# Patient Record
Sex: Male | Born: 1955 | Race: White | Hispanic: No | Marital: Married | Smoking: Never smoker
Health system: Southern US, Community
[De-identification: ages and names within clinical notes are randomized; demographics above are authoritative.]

## PROBLEM LIST (undated history)

## (undated) DIAGNOSIS — F0390 Unspecified dementia without behavioral disturbance: Secondary | ICD-10-CM

## (undated) DIAGNOSIS — I10 Essential (primary) hypertension: Secondary | ICD-10-CM

## (undated) DIAGNOSIS — K219 Gastro-esophageal reflux disease without esophagitis: Secondary | ICD-10-CM

## (undated) DIAGNOSIS — F4310 Post-traumatic stress disorder, unspecified: Secondary | ICD-10-CM

## (undated) DIAGNOSIS — G478 Other sleep disorders: Secondary | ICD-10-CM

## (undated) DIAGNOSIS — I350 Nonrheumatic aortic (valve) stenosis: Secondary | ICD-10-CM

## (undated) DIAGNOSIS — G939 Disorder of brain, unspecified: Secondary | ICD-10-CM

## (undated) DIAGNOSIS — G709 Myoneural disorder, unspecified: Secondary | ICD-10-CM

## (undated) DIAGNOSIS — S069X9A Unspecified intracranial injury with loss of consciousness of unspecified duration, initial encounter: Secondary | ICD-10-CM

## (undated) DIAGNOSIS — N189 Chronic kidney disease, unspecified: Secondary | ICD-10-CM

## (undated) DIAGNOSIS — S069XAA Unspecified intracranial injury with loss of consciousness status unknown, initial encounter: Secondary | ICD-10-CM

## (undated) DIAGNOSIS — K759 Inflammatory liver disease, unspecified: Secondary | ICD-10-CM

## (undated) DIAGNOSIS — F419 Anxiety disorder, unspecified: Secondary | ICD-10-CM

## (undated) DIAGNOSIS — Z87442 Personal history of urinary calculi: Secondary | ICD-10-CM

## (undated) DIAGNOSIS — M792 Neuralgia and neuritis, unspecified: Secondary | ICD-10-CM

## (undated) HISTORY — PX: TONSILLECTOMY: SUR1361

## (undated) HISTORY — PX: BACK SURGERY: SHX140

---

## 2004-03-26 ENCOUNTER — Inpatient Hospital Stay (HOSPITAL_COMMUNITY): Admission: AD | Admit: 2004-03-26 | Discharge: 2004-03-28 | Payer: Self-pay | Admitting: Cardiovascular Disease

## 2007-03-30 DIAGNOSIS — K760 Fatty (change of) liver, not elsewhere classified: Secondary | ICD-10-CM | POA: Insufficient documentation

## 2007-03-30 DIAGNOSIS — E785 Hyperlipidemia, unspecified: Secondary | ICD-10-CM | POA: Insufficient documentation

## 2009-02-08 DIAGNOSIS — I10 Essential (primary) hypertension: Secondary | ICD-10-CM | POA: Insufficient documentation

## 2009-07-10 DIAGNOSIS — E119 Type 2 diabetes mellitus without complications: Secondary | ICD-10-CM | POA: Insufficient documentation

## 2011-01-28 DIAGNOSIS — F4323 Adjustment disorder with mixed anxiety and depressed mood: Secondary | ICD-10-CM

## 2011-01-28 DIAGNOSIS — M545 Low back pain, unspecified: Secondary | ICD-10-CM | POA: Insufficient documentation

## 2011-01-28 HISTORY — DX: Adjustment disorder with mixed anxiety and depressed mood: F43.23

## 2011-03-02 DIAGNOSIS — F4310 Post-traumatic stress disorder, unspecified: Secondary | ICD-10-CM | POA: Insufficient documentation

## 2011-09-29 ENCOUNTER — Other Ambulatory Visit: Payer: Self-pay | Admitting: Otolaryngology

## 2011-10-09 ENCOUNTER — Encounter (HOSPITAL_COMMUNITY): Payer: Self-pay | Admitting: Pharmacy Technician

## 2011-10-15 ENCOUNTER — Encounter (HOSPITAL_COMMUNITY): Payer: Self-pay

## 2011-10-15 ENCOUNTER — Ambulatory Visit (HOSPITAL_COMMUNITY)
Admission: RE | Admit: 2011-10-15 | Discharge: 2011-10-15 | Disposition: A | Discharge status: Home / Self Care | Payer: Worker's Compensation | Source: Ambulatory Visit | Attending: Specialist | Admitting: Specialist

## 2011-10-15 ENCOUNTER — Encounter (HOSPITAL_COMMUNITY)
Admission: RE | Admit: 2011-10-15 | Discharge: 2011-10-15 | Disposition: A | Discharge status: Home / Self Care | Payer: Worker's Compensation | Source: Ambulatory Visit | Attending: Specialist | Admitting: Specialist

## 2011-10-15 DIAGNOSIS — G709 Myoneural disorder, unspecified: Secondary | ICD-10-CM

## 2011-10-15 DIAGNOSIS — M5380 Other specified dorsopathies, site unspecified: Secondary | ICD-10-CM | POA: Insufficient documentation

## 2011-10-15 DIAGNOSIS — I10 Essential (primary) hypertension: Secondary | ICD-10-CM | POA: Insufficient documentation

## 2011-10-15 DIAGNOSIS — Z01812 Encounter for preprocedural laboratory examination: Secondary | ICD-10-CM | POA: Insufficient documentation

## 2011-10-15 DIAGNOSIS — F0390 Unspecified dementia without behavioral disturbance: Secondary | ICD-10-CM

## 2011-10-15 DIAGNOSIS — K759 Inflammatory liver disease, unspecified: Secondary | ICD-10-CM

## 2011-10-15 DIAGNOSIS — N189 Chronic kidney disease, unspecified: Secondary | ICD-10-CM

## 2011-10-15 DIAGNOSIS — M5126 Other intervertebral disc displacement, lumbar region: Secondary | ICD-10-CM | POA: Insufficient documentation

## 2011-10-15 DIAGNOSIS — G478 Other sleep disorders: Secondary | ICD-10-CM

## 2011-10-15 DIAGNOSIS — I700 Atherosclerosis of aorta: Secondary | ICD-10-CM | POA: Insufficient documentation

## 2011-10-15 DIAGNOSIS — M792 Neuralgia and neuritis, unspecified: Secondary | ICD-10-CM

## 2011-10-15 DIAGNOSIS — F419 Anxiety disorder, unspecified: Secondary | ICD-10-CM

## 2011-10-15 DIAGNOSIS — Z01810 Encounter for preprocedural cardiovascular examination: Secondary | ICD-10-CM | POA: Insufficient documentation

## 2011-10-15 HISTORY — DX: Chronic kidney disease, unspecified: N18.9

## 2011-10-15 HISTORY — DX: Unspecified dementia, unspecified severity, without behavioral disturbance, psychotic disturbance, mood disturbance, and anxiety: F03.90

## 2011-10-15 HISTORY — DX: Anxiety disorder, unspecified: F41.9

## 2011-10-15 HISTORY — DX: Essential (primary) hypertension: I10

## 2011-10-15 HISTORY — DX: Myoneural disorder, unspecified: G70.9

## 2011-10-15 HISTORY — DX: Other sleep disorders: G47.8

## 2011-10-15 HISTORY — DX: Gastro-esophageal reflux disease without esophagitis: K21.9

## 2011-10-15 HISTORY — DX: Unspecified dementia without behavioral disturbance: F03.90

## 2011-10-15 HISTORY — DX: Inflammatory liver disease, unspecified: K75.9

## 2011-10-15 HISTORY — DX: Neuralgia and neuritis, unspecified: M79.2

## 2011-10-15 HISTORY — PX: HERNIA REPAIR: SHX51

## 2011-10-15 HISTORY — PX: OTHER SURGICAL HISTORY: SHX169

## 2011-10-15 LAB — COMPREHENSIVE METABOLIC PANEL
ALT: 23 U/L (ref 0–53)
AST: 18 U/L (ref 0–37)
Albumin: 4 g/dL (ref 3.5–5.2)
Alkaline Phosphatase: 57 U/L (ref 39–117)
BUN: 14 mg/dL (ref 6–23)
CO2: 24 mEq/L (ref 19–32)
Calcium: 9.6 mg/dL (ref 8.4–10.5)
Chloride: 95 mEq/L — ABNORMAL LOW (ref 96–112)
Creatinine, Ser: 0.96 mg/dL (ref 0.50–1.35)
GFR calc Af Amer: >90 mL/min (ref >90)
GFR calc non Af Amer: >90 mL/min (ref >90)
Glucose, Bld: 188 mg/dL — ABNORMAL HIGH (ref 70–99)
Potassium: 4 mEq/L (ref 3.5–5.1)
Sodium: 134 mEq/L — ABNORMAL LOW (ref 135–145)
Total Bilirubin: 0.2 mg/dL — ABNORMAL LOW (ref 0.3–1.2)
Total Protein: 7 g/dL (ref 6.0–8.3)

## 2011-10-15 LAB — DIFFERENTIAL
Basophils Absolute: 0 10*3/uL (ref 0.0–0.1)
Basophils Relative: 0 % (ref 0–1)
Eosinophils Absolute: 0.2 10*3/uL (ref 0.0–0.7)
Eosinophils Relative: 2 % (ref 0–5)
Lymphocytes Relative: 36 % (ref 12–46)
Lymphs Abs: 3 10*3/uL (ref 0.7–4.0)
Monocytes Absolute: 0.5 10*3/uL (ref 0.1–1.0)
Monocytes Relative: 6 % (ref 3–12)
Neutro Abs: 4.7 10*3/uL (ref 1.7–7.7)
Neutrophils Relative %: 56 % (ref 43–77)

## 2011-10-15 LAB — URINALYSIS, ROUTINE W REFLEX MICROSCOPIC
Bilirubin Urine: NEGATIVE
Glucose, UA: NEGATIVE mg/dL
Hgb urine dipstick: NEGATIVE
Ketones, ur: NEGATIVE mg/dL
Leukocytes, UA: NEGATIVE
Nitrite: NEGATIVE
Protein, ur: NEGATIVE mg/dL
Specific Gravity, Urine: 1.026 (ref 1.005–1.030)
Urobilinogen, UA: 0.2 mg/dL (ref 0.0–1.0)
pH: 5.5 (ref 5.0–8.0)

## 2011-10-15 LAB — CBC
HCT: 44.6 % (ref 39.0–52.0)
Hemoglobin: 15.4 g/dL (ref 13.0–17.0)
MCH: 29.7 pg (ref 26.0–34.0)
MCHC: 34.5 g/dL (ref 30.0–36.0)
MCV: 86.1 fL (ref 78.0–100.0)
Platelets: 293 10*3/uL (ref 150–400)
RBC: 5.18 MIL/uL (ref 4.22–5.81)
RDW: 13 % (ref 11.5–15.5)
WBC: 8.4 10*3/uL (ref 4.0–10.5)

## 2011-10-15 LAB — SURGICAL PCR SCREEN
MRSA, PCR: NEGATIVE
Staphylococcus aureus: NEGATIVE
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
## 2011-10-15 NOTE — Patient Instructions (Addendum)
20 NICKALAS MCCARRICK  10/15/2011   Your procedure is scheduled on:  7-17 -2013  Report to [facility] at     1030   AM .  Call this number if you have problems the morning of surgery: [phone]   Remember:   Do not eat food:After Midnight.  May have clear liquids:up to 6 Hours before arrival. Nothing after : 0600 am  Clear liquids include soda, tea, black coffee, apple or grape juice, broth.  Take these medicines the morning of surgery with A SIP OF WATER: Lamictal, Cerofolin, Ativan, Prilosec, Paxil, Vayacog. Use 1/2 dose ususal PM Insulin( 10-20-11)- Donot take Diabetic meds or insulin AM of surgery.   Do not wear jewelry, make-up or nail polish.  Do not wear lotions, powders, or perfumes. You may wear deodorant.  Do not shave 48 hours prior to surgery.(face and neck okay, no shaving of legs)  Do not bring valuables to the hospital.  Contacts, dentures or bridgework may not be worn into surgery.  Leave suitcase in the car. After surgery it may be brought to your room.  For patients admitted to the hospital, checkout time is 11:00 AM the day of discharge.   Patients discharged the day of surgery will not be allowed to drive home.  Name and phone number of your driver: spouse  Special Instructions: CHG Shower Use Special Wash: 1/2 bottle night before surgery and 1/2 bottle morning of surgery.(avoid face and genitals)   Please read over the following fact sheets that you were given: MRSA Information,  Incentive Spirometry Instruction.

## 2011-10-21 ENCOUNTER — Ambulatory Visit (HOSPITAL_COMMUNITY): Payer: Worker's Compensation

## 2011-10-21 ENCOUNTER — Encounter (HOSPITAL_COMMUNITY)
Admission: RE | Disposition: A | Discharge status: Home / Self Care | Payer: Self-pay | Source: Ambulatory Visit | Attending: Specialist

## 2011-10-21 ENCOUNTER — Observation Stay (HOSPITAL_COMMUNITY)
Admission: RE | Admit: 2011-10-21 | Discharge: 2011-10-22 | Disposition: A | Discharge status: Home / Self Care | Payer: Worker's Compensation | Source: Ambulatory Visit | Attending: Specialist | Admitting: Specialist

## 2011-10-21 ENCOUNTER — Ambulatory Visit (HOSPITAL_COMMUNITY): Payer: Worker's Compensation | Admitting: Anesthesiology

## 2011-10-21 ENCOUNTER — Encounter (HOSPITAL_COMMUNITY): Payer: Self-pay | Admitting: Anesthesiology

## 2011-10-21 ENCOUNTER — Encounter (HOSPITAL_COMMUNITY): Payer: Self-pay | Admitting: *Deleted

## 2011-10-21 DIAGNOSIS — I10 Essential (primary) hypertension: Secondary | ICD-10-CM | POA: Insufficient documentation

## 2011-10-21 DIAGNOSIS — M48061 Spinal stenosis, lumbar region without neurogenic claudication: Secondary | ICD-10-CM | POA: Diagnosis present

## 2011-10-21 DIAGNOSIS — Z79899 Other long term (current) drug therapy: Secondary | ICD-10-CM | POA: Insufficient documentation

## 2011-10-21 DIAGNOSIS — M5126 Other intervertebral disc displacement, lumbar region: Principal | ICD-10-CM | POA: Diagnosis present

## 2011-10-21 DIAGNOSIS — E119 Type 2 diabetes mellitus without complications: Secondary | ICD-10-CM | POA: Insufficient documentation

## 2011-10-21 DIAGNOSIS — K219 Gastro-esophageal reflux disease without esophagitis: Secondary | ICD-10-CM | POA: Insufficient documentation

## 2011-10-21 DIAGNOSIS — Z794 Long term (current) use of insulin: Secondary | ICD-10-CM | POA: Insufficient documentation

## 2011-10-21 DIAGNOSIS — Z7982 Long term (current) use of aspirin: Secondary | ICD-10-CM | POA: Insufficient documentation

## 2011-10-21 LAB — GLUCOSE, CAPILLARY
Glucose-Capillary: 212 mg/dL — ABNORMAL HIGH (ref 70–99)
Glucose-Capillary: 171 mg/dL — ABNORMAL HIGH (ref 70–99)
Glucose-Capillary: 151 mg/dL — ABNORMAL HIGH (ref 70–99)
Glucose-Capillary: 158 mg/dL — ABNORMAL HIGH (ref 70–99)

## 2011-10-21 SURGERY — DECOMPRESSIVE LUMBAR LAMINECTOMY LEVEL 1
Anesthesia: General | Site: Back | Laterality: Left | Wound class: Clean

## 2011-10-21 MED ORDER — NEOSTIGMINE METHYLSULFATE 1 MG/ML IJ SOLN
INTRAMUSCULAR | Status: DC | PRN
  Administered 2011-10-21: 2 mg via INTRAVENOUS

## 2011-10-21 MED ORDER — ONDANSETRON HCL 4 MG/2ML IJ SOLN: 4.0000 mg | INTRAMUSCULAR | Status: DC | PRN

## 2011-10-21 MED ORDER — NALOXONE HCL 0.4 MG/ML IJ SOLN
INTRAMUSCULAR | Status: AC
  Filled 2011-10-21: qty 1

## 2011-10-21 MED ORDER — L-METHYLFOLATE-B6-B12 3-35-2 MG PO TABS
1.0000 | ORAL_TABLET | Freq: Two times a day (BID) | ORAL | Status: DC
  Administered 2011-10-21 – 2011-10-22 (×2): 1 via ORAL
  Filled 2011-10-21 (×3): qty 1

## 2011-10-21 MED ORDER — THROMBIN 5000 UNITS EX SOLR
CUTANEOUS | Status: AC
  Filled 2011-10-21: qty 10000

## 2011-10-21 MED ORDER — OXYCODONE-ACETAMINOPHEN 7.5-325 MG PO TABS: 1.0000 | ORAL_TABLET | ORAL | Status: AC | PRN

## 2011-10-21 MED ORDER — METHOCARBAMOL 500 MG PO TABS: 500.0000 mg | ORAL_TABLET | Freq: Three times a day (TID) | ORAL | Status: AC

## 2011-10-21 MED ORDER — HYDROMORPHONE HCL PF 1 MG/ML IJ SOLN: 0.2500 mg | INTRAMUSCULAR | Status: DC | PRN

## 2011-10-21 MED ORDER — CEFAZOLIN SODIUM-DEXTROSE 2-3 GM-% IV SOLR
INTRAVENOUS | Status: AC
  Filled 2011-10-21: qty 50

## 2011-10-21 MED ORDER — LAMOTRIGINE 150 MG PO TABS
150.0000 mg | ORAL_TABLET | Freq: Every day | ORAL | Status: DC
  Administered 2011-10-22: 150 mg via ORAL
  Filled 2011-10-21: qty 1

## 2011-10-21 MED ORDER — SODIUM CHLORIDE 0.9 % IJ SOLN: 3.0000 mL | Freq: Two times a day (BID) | INTRAMUSCULAR | Status: DC

## 2011-10-21 MED ORDER — FENTANYL CITRATE 0.05 MG/ML IJ SOLN
INTRAMUSCULAR | Status: DC | PRN
  Administered 2011-10-21 (×2): 100 ug via INTRAVENOUS
  Administered 2011-10-21 (×2): 50 ug via INTRAVENOUS

## 2011-10-21 MED ORDER — CHLORHEXIDINE GLUCONATE 4 % EX LIQD
60.0000 mL | Freq: Once | CUTANEOUS | Status: DC
  Filled 2011-10-21: qty 60

## 2011-10-21 MED ORDER — CEFAZOLIN SODIUM-DEXTROSE 2-3 GM-% IV SOLR
2.0000 g | Freq: Three times a day (TID) | INTRAVENOUS | Status: AC
  Administered 2011-10-21 – 2011-10-22 (×2): 2 g via INTRAVENOUS
  Filled 2011-10-21 (×2): qty 50

## 2011-10-21 MED ORDER — BUPIVACAINE-EPINEPHRINE (PF) 0.5% -1:200000 IJ SOLN
INTRAMUSCULAR | Status: AC
  Filled 2011-10-21: qty 10

## 2011-10-21 MED ORDER — LACTATED RINGERS IV SOLN: INTRAVENOUS | Status: DC

## 2011-10-21 MED ORDER — SODIUM CHLORIDE 0.9 % IJ SOLN: 3.0000 mL | INTRAMUSCULAR | Status: DC | PRN

## 2011-10-21 MED ORDER — ACETAMINOPHEN 10 MG/ML IV SOLN
INTRAVENOUS | Status: AC
  Filled 2011-10-21: qty 100

## 2011-10-21 MED ORDER — ACETAMINOPHEN 10 MG/ML IV SOLN
INTRAVENOUS | Status: DC | PRN
  Administered 2011-10-21: 1000 mg via INTRAVENOUS

## 2011-10-21 MED ORDER — ZOLPIDEM TARTRATE 10 MG PO TABS
10.0000 mg | ORAL_TABLET | Freq: Every day | ORAL | Status: DC
  Administered 2011-10-21: 10 mg via ORAL
  Filled 2011-10-21: qty 1

## 2011-10-21 MED ORDER — THROMBIN 5000 UNITS EX SOLR
CUTANEOUS | Status: DC | PRN
  Administered 2011-10-21: 10000 [IU] via TOPICAL

## 2011-10-21 MED ORDER — HYDROMORPHONE HCL PF 1 MG/ML IJ SOLN
0.5000 mg | INTRAMUSCULAR | Status: DC | PRN
  Administered 2011-10-21 (×2): 1 mg via INTRAVENOUS
  Administered 2011-10-21: 0.5 mg via INTRAVENOUS
  Administered 2011-10-22 (×3): 1 mg via INTRAVENOUS
  Filled 2011-10-21 (×6): qty 1

## 2011-10-21 MED ORDER — BUPIVACAINE-EPINEPHRINE 0.5% -1:200000 IJ SOLN
INTRAMUSCULAR | Status: DC | PRN
  Administered 2011-10-21: 30 mL

## 2011-10-21 MED ORDER — MENTHOL 3 MG MT LOZG: 1.0000 | LOZENGE | OROMUCOSAL | Status: DC | PRN

## 2011-10-21 MED ORDER — INSULIN ASPART 100 UNIT/ML ~~LOC~~ SOLN
0.0000 [IU] | Freq: Three times a day (TID) | SUBCUTANEOUS | Status: DC
  Administered 2011-10-21: 3 [IU] via SUBCUTANEOUS
  Administered 2011-10-22: 2 [IU] via SUBCUTANEOUS
  Administered 2011-10-22: 5 [IU] via SUBCUTANEOUS

## 2011-10-21 MED ORDER — METHOCARBAMOL 500 MG PO TABS
500.0000 mg | ORAL_TABLET | Freq: Four times a day (QID) | ORAL | Status: DC | PRN
  Administered 2011-10-22: 500 mg via ORAL
  Filled 2011-10-21: qty 1

## 2011-10-21 MED ORDER — DOCUSATE SODIUM 100 MG PO CAPS
100.0000 mg | ORAL_CAPSULE | Freq: Two times a day (BID) | ORAL | Status: DC
  Administered 2011-10-21 – 2011-10-22 (×2): 100 mg via ORAL

## 2011-10-21 MED ORDER — LIDOCAINE HCL (CARDIAC) 20 MG/ML IV SOLN
INTRAVENOUS | Status: DC | PRN
  Administered 2011-10-21: 50 mg via INTRAVENOUS

## 2011-10-21 MED ORDER — EPHEDRINE SULFATE 50 MG/ML IJ SOLN
INTRAMUSCULAR | Status: DC | PRN
  Administered 2011-10-21 (×2): 10 mg via INTRAVENOUS

## 2011-10-21 MED ORDER — ACETAMINOPHEN 325 MG PO TABS: 650.0000 mg | ORAL_TABLET | ORAL | Status: DC | PRN

## 2011-10-21 MED ORDER — LISINOPRIL-HYDROCHLOROTHIAZIDE 10-12.5 MG PO TABS: 1.0000 | ORAL_TABLET | Freq: Every day | ORAL | Status: DC

## 2011-10-21 MED ORDER — LACTATED RINGERS IV SOLN
INTRAVENOUS | Status: DC | PRN
  Administered 2011-10-21 (×3): via INTRAVENOUS

## 2011-10-21 MED ORDER — GLIPIZIDE 10 MG PO TABS
10.0000 mg | ORAL_TABLET | Freq: Two times a day (BID) | ORAL | Status: DC
  Administered 2011-10-21 – 2011-10-22 (×2): 10 mg via ORAL
  Filled 2011-10-21 (×4): qty 1

## 2011-10-21 MED ORDER — SODIUM CHLORIDE 0.9 % IR SOLN
Status: DC | PRN
  Administered 2011-10-21: 14:00:00

## 2011-10-21 MED ORDER — PANTOPRAZOLE SODIUM 40 MG PO TBEC
40.0000 mg | DELAYED_RELEASE_TABLET | Freq: Every day | ORAL | Status: DC
  Administered 2011-10-22: 40 mg via ORAL
  Filled 2011-10-21: qty 1

## 2011-10-21 MED ORDER — MIDAZOLAM HCL 5 MG/5ML IJ SOLN
INTRAMUSCULAR | Status: DC | PRN
  Administered 2011-10-21 (×2): 2 mg via INTRAVENOUS

## 2011-10-21 MED ORDER — OXYCODONE-ACETAMINOPHEN 5-325 MG PO TABS
1.0000 | ORAL_TABLET | ORAL | Status: DC | PRN
  Administered 2011-10-21 – 2011-10-22 (×4): 2 via ORAL
  Filled 2011-10-21 (×4): qty 2

## 2011-10-21 MED ORDER — PAROXETINE HCL 20 MG PO TABS
20.0000 mg | ORAL_TABLET | Freq: Every day | ORAL | Status: DC
  Administered 2011-10-22: 20 mg via ORAL
  Filled 2011-10-21: qty 1

## 2011-10-21 MED ORDER — LISINOPRIL 10 MG PO TABS
10.0000 mg | ORAL_TABLET | Freq: Every day | ORAL | Status: DC
  Administered 2011-10-22: 10 mg via ORAL
  Filled 2011-10-21: qty 1

## 2011-10-21 MED ORDER — ACETAMINOPHEN 650 MG RE SUPP: 650.0000 mg | RECTAL | Status: DC | PRN

## 2011-10-21 MED ORDER — SUCCINYLCHOLINE CHLORIDE 20 MG/ML IJ SOLN
INTRAMUSCULAR | Status: DC | PRN
  Administered 2011-10-21: 100 mg via INTRAVENOUS

## 2011-10-21 MED ORDER — METFORMIN HCL 500 MG PO TABS
1000.0000 mg | ORAL_TABLET | Freq: Two times a day (BID) | ORAL | Status: DC
  Administered 2011-10-21 – 2011-10-22 (×2): 1000 mg via ORAL
  Filled 2011-10-21 (×4): qty 2

## 2011-10-21 MED ORDER — GLYCOPYRROLATE 0.2 MG/ML IJ SOLN
INTRAMUSCULAR | Status: DC | PRN
  Administered 2011-10-21: 0.3 mg via INTRAVENOUS

## 2011-10-21 MED ORDER — INSULIN GLARGINE 100 UNIT/ML ~~LOC~~ SOLN
30.0000 [IU] | Freq: Every day | SUBCUTANEOUS | Status: DC
  Administered 2011-10-21: 32 [IU] via SUBCUTANEOUS

## 2011-10-21 MED ORDER — LACTATED RINGERS IV SOLN
INTRAVENOUS | Status: DC
  Administered 2011-10-21: 1000 mL via INTRAVENOUS

## 2011-10-21 MED ORDER — CEFAZOLIN SODIUM-DEXTROSE 2-3 GM-% IV SOLR
2.0000 g | INTRAVENOUS | Status: AC
  Administered 2011-10-21: 2 g via INTRAVENOUS

## 2011-10-21 MED ORDER — ROCURONIUM BROMIDE 100 MG/10ML IV SOLN
INTRAVENOUS | Status: DC | PRN
  Administered 2011-10-21: 40 mg via INTRAVENOUS

## 2011-10-21 MED ORDER — PHENOL 1.4 % MT LIQD: 1.0000 | OROMUCOSAL | Status: DC | PRN

## 2011-10-21 MED ORDER — SODIUM CHLORIDE 0.45 % IV SOLN
INTRAVENOUS | Status: DC
  Administered 2011-10-21 – 2011-10-22 (×2): via INTRAVENOUS

## 2011-10-21 MED ORDER — HYDROCHLOROTHIAZIDE 12.5 MG PO CAPS
12.5000 mg | ORAL_CAPSULE | Freq: Every day | ORAL | Status: DC
  Administered 2011-10-22: 12.5 mg via ORAL
  Filled 2011-10-21: qty 1

## 2011-10-21 MED ORDER — METHOCARBAMOL 100 MG/ML IJ SOLN
500.0000 mg | Freq: Four times a day (QID) | INTRAVENOUS | Status: DC | PRN
  Administered 2011-10-21 – 2011-10-22 (×3): 500 mg via INTRAVENOUS
  Filled 2011-10-21 (×3): qty 5

## 2011-10-21 MED ORDER — INSULIN GLARGINE 100 UNIT/ML ~~LOC~~ SOLN: 10.0000 [IU] | Freq: Every day | SUBCUTANEOUS | Status: DC

## 2011-10-21 MED ORDER — LORAZEPAM 1 MG PO TABS
1.0000 mg | ORAL_TABLET | Freq: Two times a day (BID) | ORAL | Status: DC
  Administered 2011-10-21 – 2011-10-22 (×2): 1 mg via ORAL
  Filled 2011-10-21 (×2): qty 1

## 2011-10-21 MED ORDER — PROPOFOL 10 MG/ML IV BOLUS
INTRAVENOUS | Status: DC | PRN
  Administered 2011-10-21: 200 mg via INTRAVENOUS

## 2011-10-21 SURGICAL SUPPLY — 51 items
APL SKNCLS STERI-STRIP NONHPOA (GAUZE/BANDAGES/DRESSINGS) ×1
BAG SPEC THK2 15X12 ZIP CLS (MISCELLANEOUS) ×1
BAG ZIPLOCK 12X15 (MISCELLANEOUS) ×2 IMPLANT
BENZOIN TINCTURE PRP APPL 2/3 (GAUZE/BANDAGES/DRESSINGS) ×2 IMPLANT
CATH FOLEY LATEX FREE 16FR (CATHETERS) ×1 IMPLANT
CHLORAPREP W/TINT 26ML (MISCELLANEOUS) IMPLANT
CLEANER TIP ELECTROSURG 2X2 (MISCELLANEOUS) ×2 IMPLANT
CLOTH BEACON ORANGE TIMEOUT ST (SAFETY) ×2 IMPLANT
DECANTER SPIKE VIAL GLASS SM (MISCELLANEOUS) ×2 IMPLANT
DRAPE MICROSCOPE LEICA (MISCELLANEOUS) ×2 IMPLANT
DRAPE POUCH INSTRU U-SHP 10X18 (DRAPES) ×2 IMPLANT
DRAPE SURG 17X11 SM STRL (DRAPES) ×2 IMPLANT
DRSG ADAPTIC 3X8 NADH LF (GAUZE/BANDAGES/DRESSINGS) ×1 IMPLANT
DRSG EMULSION OIL 3X3 NADH (GAUZE/BANDAGES/DRESSINGS) IMPLANT
DRSG PAD ABDOMINAL 8X10 ST (GAUZE/BANDAGES/DRESSINGS) ×1 IMPLANT
DRSG TELFA 4X5 ISLAND ADH (GAUZE/BANDAGES/DRESSINGS) IMPLANT
DURAPREP 26ML APPLICATOR (WOUND CARE) ×2 IMPLANT
ELECT REM PT RETURN 9FT ADLT (ELECTROSURGICAL) ×2
ELECTRODE REM PT RTRN 9FT ADLT (ELECTROSURGICAL) ×1 IMPLANT
GLOVE BIOGEL PI IND STRL 8 (GLOVE) ×1 IMPLANT
GLOVE BIOGEL PI INDICATOR 8 (GLOVE) ×1
GLOVE ECLIPSE 6.5 STRL STRAW (GLOVE) ×2 IMPLANT
GLOVE INDICATOR 6.5 STRL GRN (GLOVE) ×2 IMPLANT
GLOVE SURG SS PI 8.0 STRL IVOR (GLOVE) ×4 IMPLANT
GOWN STRL NON-REIN LRG LVL3 (GOWN DISPOSABLE) ×2 IMPLANT
GOWN STRL REIN XL XLG (GOWN DISPOSABLE) ×2 IMPLANT
KIT BASIN OR (CUSTOM PROCEDURE TRAY) ×2 IMPLANT
KIT POSITIONING SURG ANDREWS (MISCELLANEOUS) ×2 IMPLANT
MANIFOLD NEPTUNE II (INSTRUMENTS) ×2 IMPLANT
NDL SPNL 18GX3.5 QUINCKE PK (NEEDLE) ×2 IMPLANT
NEEDLE SPNL 18GX3.5 QUINCKE PK (NEEDLE) ×4 IMPLANT
PATTIES SURGICAL .5 X.5 (GAUZE/BANDAGES/DRESSINGS) IMPLANT
PATTIES SURGICAL .75X.75 (GAUZE/BANDAGES/DRESSINGS) IMPLANT
PATTIES SURGICAL 1X1 (DISPOSABLE) IMPLANT
SPONGE GAUZE 4X4 12PLY (GAUZE/BANDAGES/DRESSINGS) ×1 IMPLANT
SPONGE SURGIFOAM ABS GEL 100 (HEMOSTASIS) ×2 IMPLANT
STAPLER VISISTAT (STAPLE) IMPLANT
STRIP CLOSURE SKIN 1/2X4 (GAUZE/BANDAGES/DRESSINGS) IMPLANT
SUT PROLENE 3 0 PS 2 (SUTURE) IMPLANT
SUT VIC AB 0 CT1 27 (SUTURE)
SUT VIC AB 0 CT1 27XBRD ANTBC (SUTURE) IMPLANT
SUT VIC AB 1 CT1 27 (SUTURE) ×2
SUT VIC AB 1 CT1 27XBRD ANTBC (SUTURE) ×1 IMPLANT
SUT VIC AB 1-0 CT2 27 (SUTURE) IMPLANT
SUT VIC AB 2-0 CT1 27 (SUTURE) ×2
SUT VIC AB 2-0 CT1 TAPERPNT 27 (SUTURE) ×1 IMPLANT
SUT VICRYL 0 UR6 27IN ABS (SUTURE) IMPLANT
SYRINGE 10CC LL (SYRINGE) ×2 IMPLANT
TAPE CLOTH SURG 4X10 WHT LF (GAUZE/BANDAGES/DRESSINGS) ×1 IMPLANT
TRAY LAMINECTOMY (CUSTOM PROCEDURE TRAY) ×2 IMPLANT
YANKAUER SUCT BULB TIP NO VENT (SUCTIONS) ×2 IMPLANT

## 2011-10-21 NOTE — Transfer of Care (Signed)
Immediate Anesthesia Transfer of Care Note  Patient: Jesus [name]  Procedure(s) Performed: Procedure(s) (LRB): DECOMPRESSIVE LUMBAR LAMINECTOMY LEVEL 1 (Left)  Patient Location: PACU  Anesthesia Type: General  Level of Consciousness: sedated  Airway & Oxygen Therapy: Patient Spontanous Breathing and Patient connected to face mask oxygen  Post-op Assessment: Report given to PACU RN, Post -op Vital signs reviewed and unstable, Anesthesiologist notified and Patient moving all extremities  Post vital signs: Reviewed and stable  Complications: No apparent anesthesia complications

## 2011-10-21 NOTE — Plan of Care (Signed)
Problem: Consults Goal: Diagnosis - Spinal Surgery Decompression lumbar laminectomy L4. L5
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
## 2011-10-21 NOTE — Brief Op Note (Signed)
10/21/2011  2:45 PM  PATIENT:  Jesus [name]  56 y.o. male  PRE-OPERATIVE DIAGNOSIS:  herniated disc Lumbar 4 - Lumbar 5 Left   POST-OPERATIVE DIAGNOSIS:  Herniated nucleus pulposus Lumbar four lumbar five left  PROCEDURE:  Procedure(s) (LRB): DECOMPRESSIVE LUMBAR LAMINECTOMY LEVEL 1 (Left)  SURGEON:  Surgeon(s) and Role:    * Javier Docker, MD - Primary  PHYSICIAN ASSISTANT:   ASSISTANTS: Gioffre   ANESTHESIA:   general  EBL:  Total I/O In: 2000 [I.V.:2000] Out: -   BLOOD ADMINISTERED:none  DRAINS: none   LOCAL MEDICATIONS USED:  MARCAINE     SPECIMEN:  No Specimen  DISPOSITION OF SPECIMEN:  PATHOLOGY  COUNTS:  YES  TOURNIQUET:  * No tourniquets in log *  DICTATION: .Other Dictation: Dictation Number [id number]  PLAN OF CARE: Admit for overnight observation  PATIENT DISPOSITION:  PACU - hemodynamically stable.   Delay start of Pharmacological VTE agent (>24hrs) due to surgical blood loss or risk of bleeding: yes

## 2011-10-21 NOTE — Anesthesia Postprocedure Evaluation (Signed)
  Anesthesia Post-op Note  Patient: Jesus [name]  Procedure(s) Performed: Procedure(s) (LRB): DECOMPRESSIVE LUMBAR LAMINECTOMY LEVEL 1 (Left)  Patient Location: PACU  Anesthesia Type: General  Level of Consciousness: awake and alert   Airway and Oxygen Therapy: Patient Spontanous Breathing  Post-op Pain: mild  Post-op Assessment: Post-op Vital signs reviewed, Patient's Cardiovascular Status Stable, Respiratory Function Stable, Patent Airway and No signs of Nausea or vomiting  Post-op Vital Signs: stable  Complications: No apparent anesthesia complications. He did spit out a piece of tooth in PACU. He states this is from the upper right side of his mouth. Denies any further loose tooth/teeth at this time. No noted mechanism of dental injury. Discussed this with the patient and his wife.  Denies any SOB at this time.

## 2011-10-21 NOTE — Anesthesia Preprocedure Evaluation (Addendum)
Anesthesia Evaluation  Patient identified by MRN, date of birth, ID band Patient awake    Reviewed: Allergy & Precautions, H&P , NPO status , Patient's Chart, lab work & pertinent test results  Airway Mallampati: II TM Distance: >3 FB Neck ROM: full    Dental No notable dental hx.    Pulmonary neg pulmonary ROS,  breath sounds clear to auscultation  Pulmonary exam normal       Cardiovascular Exercise Tolerance: Good hypertension, Pt. on medications negative cardio ROS  Rhythm:regular Rate:Normal     Neuro/Psych Anxiety Dementia - mild.  Mainly memory issues. Head injury. PTSD negative neurological ROS  negative psych ROS   GI/Hepatic negative GI ROS, Neg liver ROS, GERD-  Medicated and Controlled,(+) Hepatitis -, C  Endo/Other  negative endocrine ROSWell Controlled, Type 2, Insulin Dependent and Oral Hypoglycemic Agents  Renal/GU negative Renal ROS  negative genitourinary   Musculoskeletal   Abdominal   Peds  Hematology negative hematology ROS (+)   Anesthesia Other Findings   Reproductive/Obstetrics negative OB ROS                          Anesthesia Physical Anesthesia Plan  ASA: III  Anesthesia Plan: General   Post-op Pain Management:    Induction: Intravenous  Airway Management Planned: Oral ETT  Additional Equipment:   Intra-op Plan:   Post-operative Plan: Extubation in OR  Informed Consent: I have reviewed the patients History and Physical, chart, labs and discussed the procedure including the risks, benefits and alternatives for the proposed anesthesia with the patient or authorized representative who has indicated his/her understanding and acceptance.   Dental Advisory Given  Plan Discussed with: CRNA and Surgeon  Anesthesia Plan Comments:         Anesthesia Quick Evaluation

## 2011-10-22 LAB — BASIC METABOLIC PANEL
BUN: 9 mg/dL (ref 6–23)
CO2: 25 mEq/L (ref 19–32)
Calcium: 8.7 mg/dL (ref 8.4–10.5)
Chloride: 100 mEq/L (ref 96–112)
Creatinine, Ser: 0.91 mg/dL (ref 0.50–1.35)
GFR calc Af Amer: >90 mL/min (ref >90)
GFR calc non Af Amer: >90 mL/min (ref >90)
Glucose, Bld: 124 mg/dL — ABNORMAL HIGH (ref 70–99)
Potassium: 3.4 mEq/L — ABNORMAL LOW (ref 3.5–5.1)
Sodium: 136 mEq/L (ref 135–145)

## 2011-10-22 LAB — GLUCOSE, CAPILLARY
Glucose-Capillary: 143 mg/dL — ABNORMAL HIGH (ref 70–99)
Glucose-Capillary: 243 mg/dL — ABNORMAL HIGH (ref 70–99)

## 2011-10-22 NOTE — Progress Notes (Signed)
CSW consulted for SNF placement. PN reviewed. PT evaluation does not indicate need for placement at this time. Pt plans to d/c home.  Asher Muir Tynika Luddy LCSW [phone]

## 2011-10-22 NOTE — Progress Notes (Signed)
Subjective: 1 Day Post-Op Procedure(s) (LRB): DECOMPRESSIVE LUMBAR LAMINECTOMY LEVEL 1 (Left) Patient reports pain as 3 on 0-10 scale.    Objective: Vital signs in last 24 hours: Temp:  [97.1 F (36.2 C)-99 F (37.2 C)] 99 F (37.2 C) (07/18 0544) Pulse Rate:  [66-104] 103  (07/18 0544) Resp:  [8-20] 16  (07/18 0544) BP: (124-166)/(65-100) 136/89 mmHg (07/18 0544) SpO2:  [88 %-100 %] 96 % (07/18 0544) Weight:  [103.42 kg (228 lb)] 103.42 kg (228 lb) (07/17 1630)  Intake/Output from previous day: 07/17 0701 - 07/18 0700 In: 4240 [P.O.:840; I.V.:3300; IV Piggyback:100] Out: 2425 [Urine:2375; Blood:50] Intake/Output this shift:    No results found for this basename: HGB:5 in the last 72 hours No results found for this basename: WBC:2,RBC:2,HCT:2,PLT:2 in the last 72 hours  Basename 10/22/11 0400  NA 136  K 3.4*  CL 100  CO2 25  BUN 9  CREATININE 0.91  GLUCOSE 124*  CALCIUM 8.7   No results found for this basename: LABPT:2,INR:2 in the last 72 hours  Neurologically intact Neurovascular intact Sensation intact distally Intact pulses distally Dorsiflexion/Plantar flexion intact Incision: dressing C/D/I  Assessment/Plan: 1 Day Post-Op Procedure(s) (LRB): DECOMPRESSIVE LUMBAR LAMINECTOMY LEVEL 1 (Left) Discharge home with home health. Instructions given.  Jesus [name] C 10/22/2011, 7:30 AM

## 2011-10-22 NOTE — Progress Notes (Signed)
Physical Therapy Treatment Patient Details Name: Jesus [name] MRN: [id number] DOB: [birth year] Today's Date: 10/22/2011 Time: 4259-5638 PT Time Calculation (min): 52 min  PT Assessment / Plan / Recommendation Comments on Treatment Session  Pt's spouse present for entire session    Follow Up Recommendations  Home health PT    Barriers to Discharge        Equipment Recommendations  Rolling walker with 5" wheels;3 in 1 bedside comode    Recommendations for Other Services OT consult  Frequency 7X/week   Plan Discharge plan remains appropriate    Precautions / Restrictions Precautions Precautions: Back;Fall Precaution Booklet Issued: Yes (comment) Precaution Comments: pt recalls 2/4 back precautions without cues Restrictions Weight Bearing Restrictions: No   Pertinent Vitals/Pain 4/10    Mobility  Bed Mobility Bed Mobility: Rolling Left;Rolling Right;Supine to Sit;Sit to Supine Rolling Right: 4: Min guard Rolling Left: 4: Min guard Right Sidelying to Sit: 4: Min guard Supine to Sit: 4: Min guard Sit to Supine: 4: Min guard Details for Bed Mobility Assistance: Mod multimodal cues for correct log roll and move to/from sitting Transfers Transfers: Sit to Stand;Stand to Sit Sit to Stand: 4: Min guard Stand to Sit: 4: Min guard Details for Transfer Assistance: cues for sequence and technique Ambulation/Gait Ambulation/Gait Assistance: 4: Min guard;4: Min assist Ambulation/Gait: Patient Percentage: 90% Ambulation Distance (Feet): 160 Feet Assistive device: Rolling walker Ambulation/Gait Assistance Details: min cues for position from RW  Gait Pattern: Step-to pattern;Step-through pattern Gait velocity: slow General Gait Details: Marked improvement in stability from this am Stairs: Yes Stairs Assistance: 4: Min assist Stairs Assistance Details (indicate cue type and reason): cues for sequence, saftey , and RW management Stair Management Technique: Forwards;With  walker;Step to pattern;Backwards Number of Stairs: 1  (x3  2 fwd and 1 bkwd)    Exercises     PT Diagnosis: Difficulty walking  PT Problem List: Decreased activity tolerance;Decreased balance;Decreased mobility;Decreased cognition;Decreased knowledge of use of DME;Decreased knowledge of precautions;Decreased safety awareness;Pain PT Treatment Interventions: DME instruction;Gait training;Functional mobility training;Therapeutic activities;Patient/family education   PT Goals Acute Rehab PT Goals PT Goal Formulation: With patient Time For Goal Achievement: 10/26/11 Potential to Achieve Goals: Good Pt will go Supine/Side to Sit: with supervision PT Goal: Supine/Side to Sit - Progress: Progressing toward goal Pt will go Sit to Supine/Side: with supervision PT Goal: Sit to Supine/Side - Progress: Progressing toward goal Pt will go Sit to Stand: with supervision PT Goal: Sit to Stand - Progress: Progressing toward goal Pt will go Stand to Sit: with supervision PT Goal: Stand to Sit - Progress: Progressing toward goal Pt will Ambulate: 51 - 150 feet;with supervision;with rolling walker PT Goal: Ambulate - Progress: Progressing toward goal Pt will Go Up / Down Stairs: 1-2 stairs;with min assist;with rolling walker PT Goal: Up/Down Stairs - Progress: Progressing toward goal  Visit Information  Last PT Received On: 10/22/11 Assistance Needed: +1 PT/OT Co-Evaluation/Treatment: Yes    Subjective Data  Subjective: I have trouble remembering Patient Stated Goal: Decreased back/leg pain   Cognition  Overall Cognitive Status: History of cognitive impairments - at baseline Arousal/Alertness: Awake/alert Orientation Level: Appears intact for tasks assessed Behavior During Session: [facility] for tasks performed Cognition - Other Comments: Pt with hx of panic attacks associated with PTSD. Very nervous and shaky throughout.    Balance     End of Session PT - End of Session Equipment Utilized During  Treatment: Gait belt Activity Tolerance: Patient tolerated treatment well Patient left: in bed;with  call bell/phone within reach;with family/visitor present Nurse Communication: Mobility status   GP Functional Assessment Tool Used: clinical judgement Functional Limitation: Mobility: Walking and moving around Mobility: Walking and Moving Around Current Status ([id number]): At least 1 percent but less than 20 percent impaired, limited or restricted Mobility: Walking and Moving Around Goal Status [phone]): At least 1 percent but less than 20 percent impaired, limited or restricted   Jeremy Ditullio 10/22/2011, 1:25 PM

## 2011-10-22 NOTE — Op Note (Signed)
NAMECHAZE, HRUSKA NO.:  [id number]  MEDICAL RECORD NO.:  [id number]  LOCATION:  1620                         FACILITY:  [facility]  PHYSICIAN:  Jene Every, M.D.    DATE OF BIRTH:  07/15/1955  DATE OF PROCEDURE:  10/21/2011 DATE OF DISCHARGE:                              OPERATIVE REPORT   PREOPERATIVE DIAGNOSES: 1. Herniated nucleus pulposus, L4-5, left. 2. Spinal stenosis, L4-5, left.  POSTOPERATIVE DIAGNOSES: 1. Herniated nucleus pulposus, L4-5, left. 2. Spinal stenosis, L4-5, left.  PROCEDURE PERFORMED: 1. Micro lumbar decompression, L4-5, left. 2. Foraminotomies of L4 and L5. 3. Microdiskectomy, L4-5, left.  ANESTHESIA:  General.  SURGEON:  Jene Every, M.D.  ASSISTANT:  Georges Lynch. Gioffre, M.D.  SPECIMEN:  Disk, L4-5 to pathology.  HISTORY:  This is a 56 year old with refractory L4-5 radicular pain secondary foraminal HNP, lateral recess stenosis refractory to conservative treatment, indicative for lumbar decompression.  Risks and benefits were discussed including bleeding, infection, damage to vascular structures.  No change in symptoms, worsening symptoms, need for repeat debridement, DVT, PE, anesthetic complications, etc.  TECHNIQUE:  With the patient in supine position, after induction of adequate general anesthesia and 2 g Kefzol, placed prone on the [place] frame.  All bony prominences were well padded.  Lumbar region was prepped and draped in the usual sterile fashion.  An 18-gauge spinal needle was utilized to localize L4-5 interspace, confirmed with x-ray. Incision was made from spinous process L4-5.  Subcutaneous tissue was dissected by electrocautery to achieve hemostasis.  McCullough retractors were placed.  Operating microscope was draped on the surgical field.  Confirmatory radiograph obtained with Penfield at L4-5.  A small interlaminar window was noted at L4-5 on the left hypertrophic facet. We used an osteotome and  removed one-third of the medial portion of the facet.  Hemilaminotomy at the caudad edge of L4 preserving ample section of the pars.  Detached the ligamentum flavum.  Ligamentum flavum detached from the cephalad edge of L5 utilizing straight curette. Ligamentum flavum removed from the interspace meticulously after a neuro patty placed beneath the ligamentum flavum.  Severe lateral recess stenosis was noted.  We decompressed lateral recess to the medial border of the pedicle.  We then performed a foraminotomy of L5, identified the L5 root and gently mobilized it medially.  I then performed a foraminotomy of L4, as there was stenosis of L4 due to the disk degeneration and facet hypertrophy.  We protected the L4 root at all times.  Focal HNP was noted and vascular lesion was noted as well. Bipolar electrocautery was utilized to achieve hemostasis.  Annulotomy performed.  Copious portion of disk material was removed from the disk space with a straight upbiting pituitary from both sides of the operative field out into the foramen of L4 after mobilizing with an Epstein.  Following full diskectomy of herniated material, we checked the axilla, the foramen, beneath the thecal sac.  No evidence of residual disk herniation.  Hockey-stick probe passed freely up the foramen of L4 and L5.  No evidence of CSF leakage or active bleeding. Confirmatory radiograph obtained.  Disk space was copiously irrigated with antibiotic irrigation.  Following this, we removed  the [facility] retractor.  Paraspinous muscles inspected, no evidence of active bleeding.  Dorsolumbar fascia reapproximated with #1 Vicryl interrupted figure-of-eight sutures, subcu with 2-0 Vicryl simple sutures, and skin was reapproximated with 4-0 subcuticular Prolene.  Wound reinforced with Steri-Strips and sterile dressing applied, placed supine on hospital bed, extubated without difficulty, and transported to recovery in satisfactory  condition.  The patient tolerated the procedure well.  No complications.  Assistant was Dr. Ranee Gosselin.  Minimal blood loss.     Jene Every, M.D.     Cordelia Pen  D:  10/21/2011  T:  10/22/2011  Job:  [id number]

## 2011-10-22 NOTE — Progress Notes (Signed)
Pt to d/c home with equipment and home-health PT. AVS reviewed. Pt and wife capable of verbalizing medications and follow-up appointments. Remains hemodynamically stable. No signs and symptoms of distress. Educated pt to return to ER in the case of SOB, dizziness, or chest pain.

## 2011-10-22 NOTE — Evaluation (Signed)
Occupational Therapy Evaluation Patient Details Name: Jesus [name] MRN: [id number] DOB: January 13, 1956 Today's Date: 10/22/2011 Time: 4098-1191 OT Time Calculation (min): 35 min  OT Assessment / Plan / Recommendation Clinical Impression  Pt is a 56 yo male who presents POD 1 lumbar decompression L4-L5. Skilled OT recommended to maximize I w/BADLs to supervision level in prep for safe d/c home with 24/7 A and HHOT. Feel pt would benefit from another day of therapy before returning home.    OT Assessment  Patient needs continued OT Services    Follow Up Recommendations  Home health OT;Supervision/Assistance - 24 hour    Barriers to Discharge      Equipment Recommendations  Rolling walker with 5" wheels;3 in 1 bedside comode    Recommendations for Other Services    Frequency  Min 2X/week    Precautions / Restrictions Precautions Precautions: Back;Fall Precaution Booklet Issued: Yes (comment) Precaution Comments: provided by OT Restrictions Weight Bearing Restrictions: No   Pertinent Vitals/Pain Reported significant pain following ambulation which he did not rate. Repositioned for comfort.    ADL  Grooming: Simulated;Minimal assistance Where Assessed - Grooming: Supported standing Upper Body Bathing: Simulated;Minimal assistance Where Assessed - Upper Body Bathing: Unsupported sitting Lower Body Bathing: Simulated;Moderate assistance Where Assessed - Lower Body Bathing: Supported sit to stand Upper Body Dressing: Simulated;Minimal assistance Where Assessed - Upper Body Dressing: Supported sit to stand Lower Body Dressing: Simulated;Maximal assistance Where Assessed - Lower Body Dressing: Supported sit to Pharmacist, hospital: Performed;Minimal Dentist Method: Sit to Barista: Raised toilet seat with arms (or 3-in-1 over toilet) Toileting - Clothing Manipulation and Hygiene: Simulated;Minimal assistance Where Assessed - International aid/development worker and Hygiene: Sit to stand from 3-in-1 or toilet Equipment Used: Rolling walker Transfers/Ambulation Related to ADLs: Pt ambulated to the bathroom with min A. Pt very shaky, nervous throughout eval. Became tearful at times. ADL Comments: Pt educated in all back precautions, provided handout. Unsure how much pt actually absorbed 2* mental state.    OT Diagnosis: Generalized weakness  OT Problem List: Decreased activity tolerance;Decreased safety awareness;Decreased knowledge of use of DME or AE;Decreased knowledge of precautions;Pain OT Treatment Interventions: Self-care/ADL training;Therapeutic activities;DME and/or AE instruction;Patient/family education   OT Goals Acute Rehab OT Goals OT Goal Formulation: With patient Time For Goal Achievement: 10/29/11 Potential to Achieve Goals: Good ADL Goals Pt Will Perform Grooming: with supervision;Standing at sink ADL Goal: Grooming - Progress: Goal set today Pt Will Perform Upper Body Bathing: with set-up;Sitting, edge of bed;Sitting, chair;Unsupported ADL Goal: Upper Body Bathing - Progress: Goal set today Pt Will Perform Lower Body Bathing: with supervision;Sit to stand from chair;Sit to stand from bed ADL Goal: Lower Body Bathing - Progress: Goal set today Pt Will Perform Upper Body Dressing: with set-up;Sitting, bed;Sitting, chair;Unsupported ADL Goal: Upper Body Dressing - Progress: Goal set today Pt Will Perform Lower Body Dressing: with supervision;Sit to stand from bed;Sit to stand from chair ADL Goal: Lower Body Dressing - Progress: Goal set today Pt Will Transfer to Toilet: with supervision;3-in-1;Raised toilet seat with arms;Ambulation;Maintaining back safety precautions ADL Goal: Toilet Transfer - Progress: Goal set today Pt Will Perform Toileting - Clothing Manipulation: with supervision;Standing ADL Goal: Toileting - Clothing Manipulation - Progress: Goal set today Pt Will Perform Toileting - Hygiene: with  supervision;Sit to stand from 3-in-1/toilet ADL Goal: Toileting - Hygiene - Progress: Goal set today Miscellaneous OT Goals Miscellaneous OT Goal #1: Pt will recall 3/3 back precautions and incorporate into selfcare tasks  with min VCs. OT Goal: Miscellaneous Goal #1 - Progress: Goal set today  Visit Information  Last OT Received On: 10/22/11 Assistance Needed: +2 (for safety) PT/OT Co-Evaluation/Treatment: Yes    Subjective Data  Subjective: Lavenia Atlas been living like this for 9 months Patient Stated Goal: I dont know   Prior Functioning  Vision/Perception  Home Living Lives With: Spouse Available Help at Discharge: Family;Available 24 hours/day Type of Home: House Home Access: Stairs to enter [employer] of Steps: 4 Entrance Stairs-Rails: None Home Layout: One level Bathroom Shower/Tub: Health visitor: Standard Home Adaptive Equipment: None Prior Function Level of Independence: Independent Able to Take Stairs?: Yes Driving: Yes Vocation: Full time employment Communication Communication: No difficulties Dominant Hand: Right      Cognition  Overall Cognitive Status: No family/caregiver present to determine baseline cognitive functioning Arousal/Alertness: Awake/alert Orientation Level: Appears intact for tasks assessed Behavior During Session: Anxious Cognition - Other Comments: Pt with hx of panic attacks associated with PTSD. Very nervous and shaky throughout.    Extremity/Trunk Assessment Right Upper Extremity Assessment RUE ROM/Strength/Tone: [facility] for tasks assessed Left Upper Extremity Assessment LUE ROM/Strength/Tone: WFL for tasks assessed Right Lower Extremity Assessment RLE ROM/Strength/Tone: WFL for tasks assessed Left Lower Extremity Assessment LLE ROM/Strength/Tone: WFL for tasks assessed   Mobility Bed Mobility Bed Mobility: Rolling Right;Right Sidelying to Sit Rolling Right: 3: Mod assist Right Sidelying to Sit: 4: Min  assist;HOB flat Supine to Sit: 4: Min assist Details for Bed Mobility Assistance: Max cues for correct log roll technique. Transfers Transfers: Sit to Stand;Stand to Sit Sit to Stand: 4: Min assist;3: Mod assist;With upper extremity assist;From bed;From chair/3-in-1 Stand to Sit: 4: Min assist;With upper extremity assist;With armrests;To chair/3-in-1 Details for Transfer Assistance: Max VCs for safe manipulation of RW, hand placement. Assist needed to rise and stabilize. Pt initially presents with a posterior lean.   Exercise    Balance    End of Session OT - End of Session Activity Tolerance: Other (comment) (limited by anxiousness.) Patient left: in chair;with call bell/phone within reach  GO Functional Assessment Tool Used: Clinical Judgement Functional Limitation: Self care Self Care Current Status (Z6109): At least 40 percent but less than 60 percent impaired, limited or restricted Self Care Goal Status (U0454): At least 1 percent but less than 20 percent impaired, limited or restricted   Jasiya Markie A OTR/L [phone] 10/22/2011, 11:59 AM

## 2011-10-22 NOTE — Evaluation (Signed)
Physical Therapy Evaluation Patient Details Name: Jesus [name] MRN: [id number] DOB: April 08, 1955 Today's Date: 10/22/2011 Time:  -     PT Assessment / Plan / Recommendation Clinical Impression       PT Assessment  Patient needs continued PT services    Follow Up Recommendations  Home health PT    Barriers to Discharge        Equipment Recommendations  Rolling walker with 5" wheels;3 in 1 bedside comode    Recommendations for Other Services OT consult   Frequency 7X/week    Precautions / Restrictions Precautions Precautions: Back;Fall Precaution Booklet Issued: Yes (comment) Precaution Comments: provided by OT Restrictions Weight Bearing Restrictions: No   Pertinent Vitals/Pain 6/10      Mobility  Bed Mobility Bed Mobility: Rolling Right;Right Sidelying to Sit Rolling Right: 3: Mod assist Right Sidelying to Sit: 4: Min assist;HOB flat Supine to Sit: 4: Min assist Details for Bed Mobility Assistance: Max cues for correct log roll technique. Transfers Transfers: Sit to Stand;Stand to Sit Sit to Stand: 4: Min assist;3: Mod assist;With upper extremity assist;From bed;From chair/3-in-1 Stand to Sit: 4: Min assist;With upper extremity assist;With armrests;To chair/3-in-1 Details for Transfer Assistance: Max VCs for safe manipulation of RW, hand placement. Assist needed to rise and stabilize. Pt initially presents with a posterior lean. Ambulation/Gait Ambulation/Gait Assistance: 1: +2 Total assist Ambulation/Gait: Patient Percentage: 90% Assistive device: Rolling walker Gait Pattern: Step-to pattern;Step-through pattern Gait velocity: slow General Gait Details: Pt initially retropulsive with standing and mildly unstable in all directions during gait    Exercises     PT Diagnosis: Difficulty walking  PT Problem List: Decreased activity tolerance;Decreased balance;Decreased mobility;Decreased cognition;Decreased knowledge of use of DME;Decreased knowledge of  precautions;Decreased safety awareness;Pain PT Treatment Interventions: DME instruction;Gait training;Functional mobility training;Therapeutic activities;Patient/family education   PT Goals Acute Rehab PT Goals PT Goal Formulation: With patient Time For Goal Achievement: 10/26/11 Potential to Achieve Goals: Good Pt will go Supine/Side to Sit: with supervision Pt will go Sit to Supine/Side: with supervision Pt will go Sit to Stand: with supervision Pt will go Stand to Sit: with supervision Pt will Ambulate: 51 - 150 feet;with supervision;with rolling walker Pt will Go Up / Down Stairs: 1-2 stairs;with min assist;with rolling walker  Visit Information  Last PT Received On: 10/22/11 Assistance Needed: +2 (for safety) PT/OT Co-Evaluation/Treatment: Yes    Subjective Data  Subjective: I'm not allowed to be alone at home Patient Stated Goal: Decreased back/leg pain   Prior Functioning  Home Living Lives With: Spouse Available Help at Discharge: Family Type of Home: House Home Access: Stairs to enter Secretary/administrator of Steps: 4 Home Layout: One level Home Adaptive Equipment: None Prior Function Level of Independence: Independent Able to Take Stairs?: Yes Communication Communication: No difficulties    Cognition  Overall Cognitive Status: No family/caregiver present to determine baseline cognitive functioning Arousal/Alertness: Awake/alert Orientation Level: Appears intact for tasks assessed Behavior During Session: Anxious Cognition - Other Comments: Pt with hx of panic attacks associated with PTSD. Very nervous and shaky throughout.    Extremity/Trunk Assessment Right Upper Extremity Assessment RUE ROM/Strength/Tone: [facility] for tasks assessed Left Upper Extremity Assessment LUE ROM/Strength/Tone: WFL for tasks assessed Right Lower Extremity Assessment RLE ROM/Strength/Tone: [facility] for tasks assessed Left Lower Extremity Assessment LLE ROM/Strength/Tone: WFL for tasks  assessed   Balance    End of Session PT - End of Session Equipment Utilized During Treatment: Gait belt Activity Tolerance: Patient tolerated treatment well;Patient limited by pain Patient left:  in chair;with call bell/phone within reach Nurse Communication: Mobility status  GP Functional Assessment Tool Used: clinical judgement Functional Limitation: Mobility: Walking and moving around Mobility: Walking and Moving Around Current Status [phone]): At least 20 percent but less than 40 percent impaired, limited or restricted Mobility: Walking and Moving Around Goal Status [phone]): At least 1 percent but less than 20 percent impaired, limited or restricted   Kristoffer Bala 10/22/2011, 1:12 PM

## 2011-10-22 NOTE — Care Management Note (Signed)
    Page 1 of 2   10/22/2011     4:18:44 PM   CARE MANAGEMENT NOTE 10/22/2011  Patient:  Jesus [name], Jesus [name]   Account Number:  [id number]  Date Initiated:  10/22/2011  Documentation initiated by:  Colleen Can  Subjective/Objective Assessment:   DX HERNIATED DISC L4-5; DECOMPRESSIVE LUMBAR LAMINECTOMY LEVEL-1     Action/Plan:   CM SPOKE WITH PATIENT ANS SPOUSE'Plans are for patient to return to his home in [place] where spouse will be caregiver. Will need rw and 3n1 'HHPt has been ordered   Anticipated DC Date:  10/22/2011   Anticipated DC Plan:  HOME W HOME HEALTH SERVICES  In-house referral  Clinical Social Worker      DC Associate Professor  CM consult      [facility] Choice  HOME HEALTH  DURABLE MEDICAL EQUIPMENT   Choice offered to / List presented to:  C-1 Patient   DME arranged  3-N-1  Levan Hurst      DME agency  [facility]     HH arranged  HH-2 PT      HH agency  OTHER - SEE NOTE   Status of service:  Completed, signed off Medicare Important Message given?  NO (If response is "NO", the following Medicare IM given date fields will be blank) Date Medicare IM given:   Date Additional Medicare IM given:    Discharge Disposition:  HOME W HOME HEALTH SERVICES  Per UR Regulation:  Reviewed for med. necessity/level of care/duration of stay  If discussed at Long Length of Stay Meetings, dates discussed:    Comments:  10/22/2011 Colleen Can, RN BSN CCM [phone] Worker's comp [phone], DOI 12/14/10-[place] adjuster-Robin [phone] Case manager for worker Teena [phone] CM made contact with Worker's comp case manager-Jane Doggett who advised to get DME=rw and 3n1 from [facility]; states she will facilitate setting up HHpt. HH orders, op report, H&P faxed at her request to [phone] Notified [facility] rep of request for services. States she will need to get authorization from [employer] information given to rep. received call from pat Jesus [name] to fax [facility] orders to [phone] /states she will notifiy patient of [facility] agency that will service him Pt discharged to home after receiving RW and 3N1.
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
## 2012-09-10 DIAGNOSIS — F329 Major depressive disorder, single episode, unspecified: Secondary | ICD-10-CM | POA: Insufficient documentation

## 2012-09-10 DIAGNOSIS — F32A Depression, unspecified: Secondary | ICD-10-CM | POA: Insufficient documentation

## 2013-11-21 IMAGING — CR DG SPINE 1V PORT
1 series · 1 of 1 positions shown · non-contrast
Comparison: Preoperative study 10/15/2011.

CLINICAL DATA: 56-year-old male undergoing lumbar surgery.

PORTABLE SPINE - 1 VIEW

[lateral]
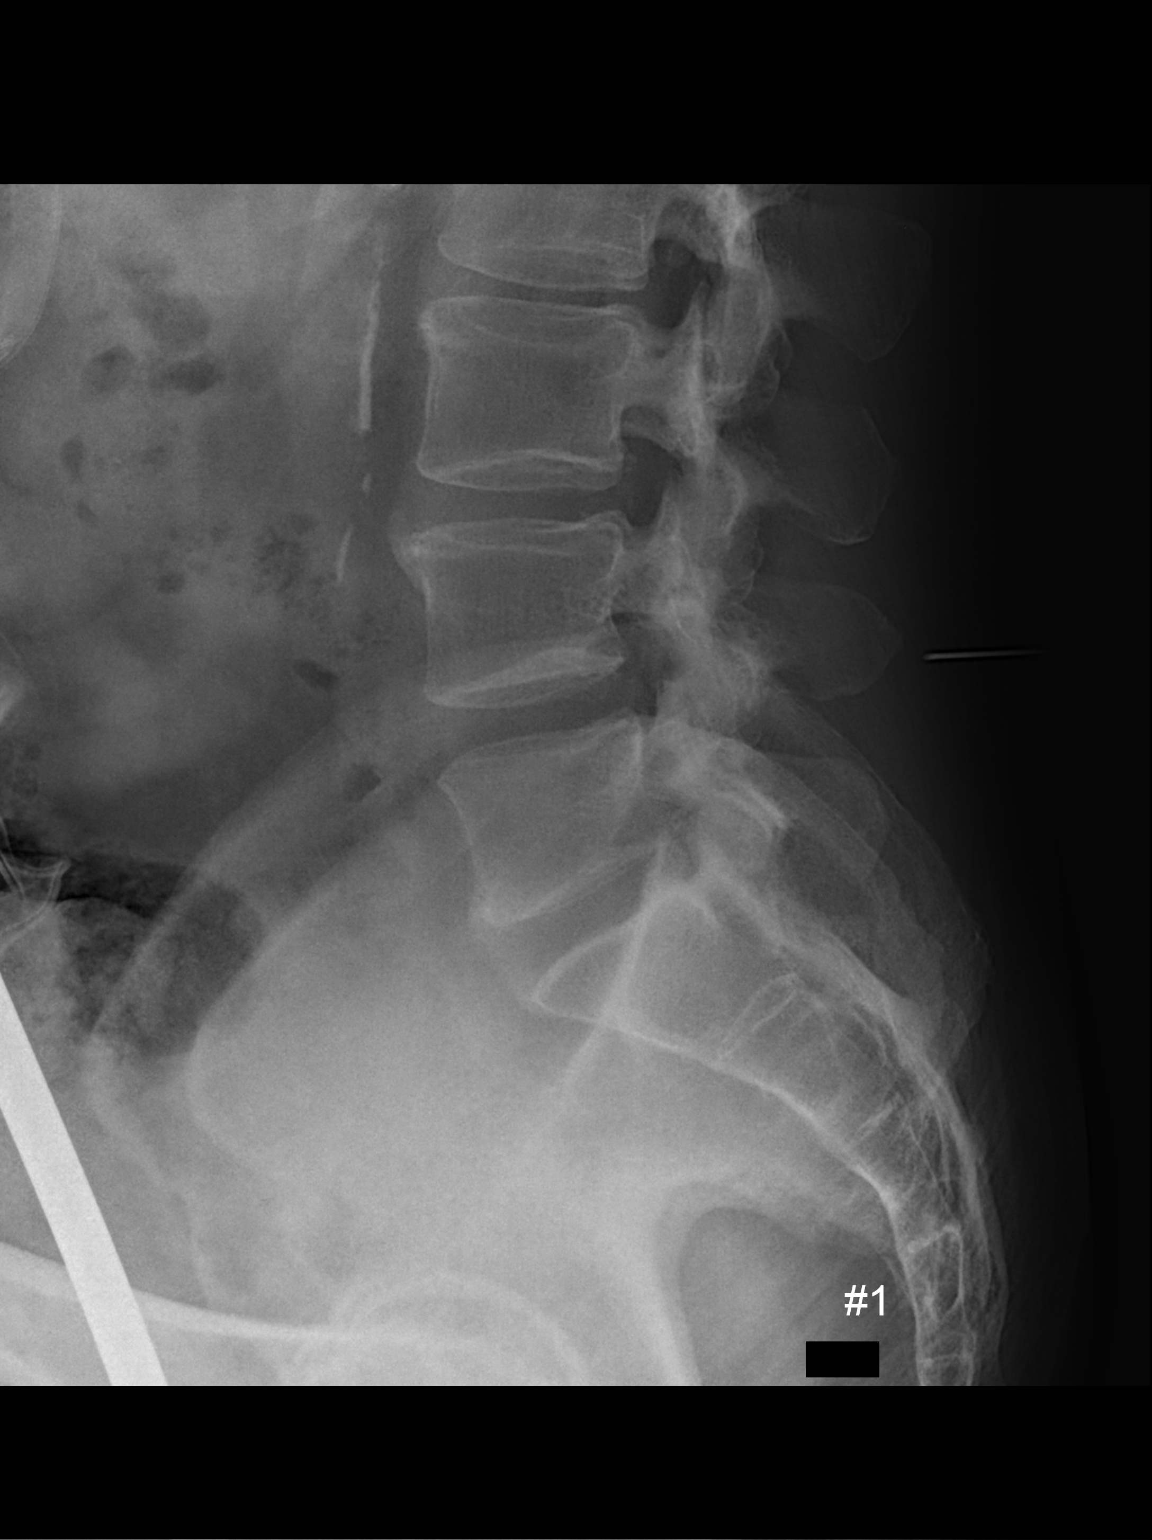

[1 of 1 positions shown; findings below may reference images not displayed]

FINDINGS: Portable cross-table lateral intraoperative view of the
lumbar spine labeled film #1 at 5153 hours..

Single needle in place from a posterior approach is directed at the
L4 spinous process.
IMPRESSION: Intraoperative localization at L4.

## 2015-03-11 DIAGNOSIS — F09 Unspecified mental disorder due to known physiological condition: Secondary | ICD-10-CM | POA: Insufficient documentation

## 2015-07-23 DIAGNOSIS — N200 Calculus of kidney: Secondary | ICD-10-CM | POA: Insufficient documentation

## 2016-01-08 DIAGNOSIS — S43431A Superior glenoid labrum lesion of right shoulder, initial encounter: Secondary | ICD-10-CM | POA: Insufficient documentation

## 2016-01-08 DIAGNOSIS — M542 Cervicalgia: Secondary | ICD-10-CM | POA: Insufficient documentation

## 2017-06-17 DIAGNOSIS — R011 Cardiac murmur, unspecified: Secondary | ICD-10-CM | POA: Insufficient documentation

## 2017-06-17 DIAGNOSIS — Z01810 Encounter for preprocedural cardiovascular examination: Secondary | ICD-10-CM | POA: Insufficient documentation

## 2017-06-17 DIAGNOSIS — M25511 Pain in right shoulder: Secondary | ICD-10-CM | POA: Insufficient documentation
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
## 2017-08-31 NOTE — Pre-Procedure Instructions (Addendum)
Jesus [name]  08/31/2017    Your procedure is scheduled on Friday, Sep 03, 2017 at 11:00 AM.   Report to [facility] Entrance "A" Admitting Office at 9:00 AM.   Call this number if you have problems the morning of surgery: [phone]   Questions prior to day of surgery, please call [phone] between 8 & 4 PM.   Remember:  Do not eat food or drink liquids after midnight Thursday, 09/02/17.  Take these medicines the morning of surgery with A SIP OF WATER: Omeprazole (Prilosec), Vortioxetine (Trintellix  Thursday evening, take 1/2 of your regular dose of Lantus (according to your blood sugar level). Take 70% of your Novolog 70/30 insulin (according to the sliding scale) Thursday evening. Do not take the Novolog 70/30 the morning of surgery. Do not take Metformin the morning of surgery.   How to Manage Your Diabetes Before Surgery   Why is it important to control my blood sugar before and after surgery?   Improving blood sugar levels before and after surgery helps healing and can limit problems.  A way of improving blood sugar control is eating a healthy diet by:  - Eating less sugar and carbohydrates  - Increasing activity/exercise  - Talk with your doctor about reaching your blood sugar goals  High blood sugars (greater than 180 mg/dL) can raise your risk of infections and slow down your recovery so you will need to focus on controlling your diabetes during the weeks before surgery.  Make sure that the doctor who takes care of your diabetes knows about your planned surgery including the date and location.  How do I manage my blood sugars before surgery?   Check your blood sugar at least 4 times a day, 2 days before surgery to make sure that they are not too high or low.  Check your blood sugar the morning of your surgery when you wake up and every 2 hours until you get to the Short-Stay unit.  Treat a low blood sugar (less than 70 mg/dL) with 1/2 cup of clear  juice (cranberry or apple), 4 glucose tablets, OR glucose gel.  Recheck blood sugar in 15 minutes after treatment (to make sure it is greater than 70 mg/dL).  If blood sugar is not greater than 70 mg/dL on re-check, call [phone] for further instructions.   Report your blood sugar to the Short-Stay nurse when you get to Short-Stay.  References:  [facility], 2007 "How to Manage your Diabetes Before and After Surgery".    Do not wear jewelry.  Do not wear lotions, powders, cologne or deodorant.  Men may shave face and neck.  Do not bring valuables to the hospital.  [facility] is not responsible for any belongings or valuables.  Contacts, dentures or bridgework may not be worn into surgery.  Leave your suitcase in the car.  After surgery it may be brought to your room.  For patients admitted to the hospital, discharge time will be determined by your treatment team.  Patients discharged the day of surgery will not be allowed to drive home.   [place] - Preparing for Surgery  Before surgery, you can play an important role.  Because skin is not sterile, your skin needs to be as free of germs as possible.  You can reduce the number of germs on you skin by washing with CHG (chlorahexidine gluconate) soap before surgery.  CHG is an antiseptic cleaner which kills germs and bonds  with the skin to continue killing germs even after washing.  Oral Hygiene is also important in reducing the risk of infection.  Remember to brush your teeth with your regular toothpaste the morning of surgery.  Please DO NOT use if you have an allergy to CHG or antibacterial soaps.  If your skin becomes reddened/irritated stop using the CHG and inform your nurse when you arrive at Short Stay.  Do not shave (including legs and underarms) for at least 48 hours prior to the first CHG shower.  You may shave your face.  Please follow these instructions carefully:   1.  Shower with CHG Soap  the night before surgery and the morning of Surgery.  2.  If you choose to wash your hair, wash your hair first as usual with your normal shampoo.  3.  After you shampoo, rinse your hair and body thoroughly to remove the shampoo. 4.  Use CHG as you would any other liquid soap.  You can apply chg directly to the skin and wash gently with a      scrungie or washcloth.           5.  Apply the CHG Soap to your body ONLY FROM THE NECK DOWN.   Do not use on open wounds or open sores. Avoid contact with your eyes, ears, mouth and genitals (private parts).  Wash genitals (private parts) with your normal soap.  6.  Wash thoroughly, paying special attention to the area where your surgery will be performed.  7.  Thoroughly rinse your body with warm water from the neck down.  8.  DO NOT shower/wash with your normal soap after using and rinsing off the CHG Soap.  9.  Pat yourself dry with a clean towel.            10.  Wear clean pajamas.            11.  Place clean sheets on your bed the night of your first shower and do not sleep with pets.  Day of Surgery  Shower as above. Do not apply any lotions/deodorants the morning of surgery.   Please wear clean clothes to the hospital. Remember to brush your teeth with toothpaste.    Please read over the fact sheets that you were given.

## 2017-09-01 ENCOUNTER — Encounter (HOSPITAL_COMMUNITY)
Admission: RE | Admit: 2017-09-01 | Discharge: 2017-09-01 | Disposition: A | Discharge status: Home / Self Care | Payer: Medicare Other | Source: Ambulatory Visit | Attending: Orthopedic Surgery | Admitting: Orthopedic Surgery

## 2017-09-01 ENCOUNTER — Encounter (HOSPITAL_COMMUNITY): Payer: Self-pay

## 2017-09-01 ENCOUNTER — Other Ambulatory Visit: Payer: Self-pay

## 2017-09-01 DIAGNOSIS — K219 Gastro-esophageal reflux disease without esophagitis: Secondary | ICD-10-CM | POA: Diagnosis not present

## 2017-09-01 DIAGNOSIS — Z79899 Other long term (current) drug therapy: Secondary | ICD-10-CM | POA: Diagnosis not present

## 2017-09-01 DIAGNOSIS — I10 Essential (primary) hypertension: Secondary | ICD-10-CM | POA: Diagnosis not present

## 2017-09-01 DIAGNOSIS — Z794 Long term (current) use of insulin: Secondary | ICD-10-CM | POA: Diagnosis not present

## 2017-09-01 DIAGNOSIS — M19011 Primary osteoarthritis, right shoulder: Secondary | ICD-10-CM | POA: Diagnosis not present

## 2017-09-01 DIAGNOSIS — X58XXXA Exposure to other specified factors, initial encounter: Secondary | ICD-10-CM | POA: Diagnosis not present

## 2017-09-01 DIAGNOSIS — G709 Myoneural disorder, unspecified: Secondary | ICD-10-CM | POA: Diagnosis not present

## 2017-09-01 DIAGNOSIS — F0781 Postconcussional syndrome: Secondary | ICD-10-CM | POA: Diagnosis not present

## 2017-09-01 DIAGNOSIS — E119 Type 2 diabetes mellitus without complications: Secondary | ICD-10-CM | POA: Diagnosis not present

## 2017-09-01 DIAGNOSIS — F4310 Post-traumatic stress disorder, unspecified: Secondary | ICD-10-CM | POA: Diagnosis not present

## 2017-09-01 DIAGNOSIS — M75101 Unspecified rotator cuff tear or rupture of right shoulder, not specified as traumatic: Secondary | ICD-10-CM | POA: Diagnosis present

## 2017-09-01 DIAGNOSIS — F0390 Unspecified dementia without behavioral disturbance: Secondary | ICD-10-CM | POA: Diagnosis not present

## 2017-09-01 DIAGNOSIS — S43431A Superior glenoid labrum lesion of right shoulder, initial encounter: Secondary | ICD-10-CM | POA: Diagnosis not present

## 2017-09-01 DIAGNOSIS — F419 Anxiety disorder, unspecified: Secondary | ICD-10-CM | POA: Diagnosis not present

## 2017-09-01 HISTORY — DX: Unspecified intracranial injury with loss of consciousness of unspecified duration, initial encounter: S06.9X9A

## 2017-09-01 HISTORY — DX: Post-traumatic stress disorder, unspecified: F43.10

## 2017-09-01 HISTORY — DX: Personal history of urinary calculi: Z87.442

## 2017-09-01 HISTORY — DX: Nonrheumatic aortic (valve) stenosis: I35.0

## 2017-09-01 HISTORY — DX: Unspecified intracranial injury with loss of consciousness status unknown, initial encounter: S06.9XAA

## 2017-09-01 HISTORY — DX: Disorder of brain, unspecified: G93.9

## 2017-09-01 LAB — BASIC METABOLIC PANEL
Anion gap: 9 (ref 5–15)
BUN: 14 mg/dL (ref 6–20)
CO2: 26 mmol/L (ref 22–32)
Calcium: 9.1 mg/dL (ref 8.9–10.3)
Chloride: 102 mmol/L (ref 101–111)
Creatinine, Ser: 1.23 mg/dL (ref 0.61–1.24)
GFR calc Af Amer: >60 mL/min (ref >60)
GFR calc non Af Amer: >60 mL/min (ref >60)
Glucose, Bld: 185 mg/dL — ABNORMAL HIGH (ref 65–99)
Potassium: 3.8 mmol/L (ref 3.5–5.1)
Sodium: 137 mmol/L (ref 135–145)

## 2017-09-01 LAB — CBC
HCT: 35 % — ABNORMAL LOW (ref 39.0–52.0)
Hemoglobin: 10.8 g/dL — ABNORMAL LOW (ref 13.0–17.0)
MCH: 23.2 pg — ABNORMAL LOW (ref 26.0–34.0)
MCHC: 30.9 g/dL (ref 30.0–36.0)
MCV: 75.3 fL — ABNORMAL LOW (ref 78.0–100.0)
Platelets: 331 10*3/uL (ref 150–400)
RBC: 4.65 MIL/uL (ref 4.22–5.81)
RDW: 16.4 % — ABNORMAL HIGH (ref 11.5–15.5)
WBC: 7.1 10*3/uL (ref 4.0–10.5)

## 2017-09-01 LAB — GLUCOSE, CAPILLARY: Glucose-Capillary: 288 mg/dL — ABNORMAL HIGH (ref 65–99)
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
## 2017-09-02 ENCOUNTER — Encounter (HOSPITAL_COMMUNITY): Payer: Self-pay
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
## 2017-09-03 ENCOUNTER — Ambulatory Visit (HOSPITAL_COMMUNITY): Payer: Medicare Other | Admitting: Anesthesiology

## 2017-09-03 ENCOUNTER — Ambulatory Visit (HOSPITAL_COMMUNITY)
Admission: RE | Admit: 2017-09-03 | Discharge: 2017-09-03 | Disposition: A | Discharge status: Home / Self Care | Payer: Medicare Other | Source: Ambulatory Visit | Attending: Orthopedic Surgery | Admitting: Orthopedic Surgery

## 2017-09-03 ENCOUNTER — Encounter (HOSPITAL_COMMUNITY)
Admission: RE | Disposition: A | Discharge status: Home / Self Care | Payer: Self-pay | Source: Ambulatory Visit | Attending: Orthopedic Surgery

## 2017-09-03 ENCOUNTER — Ambulatory Visit (HOSPITAL_COMMUNITY): Payer: Medicare Other | Admitting: Emergency Medicine

## 2017-09-03 ENCOUNTER — Encounter (HOSPITAL_COMMUNITY): Payer: Self-pay | Admitting: *Deleted

## 2017-09-03 DIAGNOSIS — M19011 Primary osteoarthritis, right shoulder: Secondary | ICD-10-CM | POA: Diagnosis not present

## 2017-09-03 DIAGNOSIS — K219 Gastro-esophageal reflux disease without esophagitis: Secondary | ICD-10-CM | POA: Insufficient documentation

## 2017-09-03 DIAGNOSIS — Z79899 Other long term (current) drug therapy: Secondary | ICD-10-CM | POA: Insufficient documentation

## 2017-09-03 DIAGNOSIS — F0781 Postconcussional syndrome: Secondary | ICD-10-CM | POA: Insufficient documentation

## 2017-09-03 DIAGNOSIS — F419 Anxiety disorder, unspecified: Secondary | ICD-10-CM | POA: Diagnosis not present

## 2017-09-03 DIAGNOSIS — X58XXXA Exposure to other specified factors, initial encounter: Secondary | ICD-10-CM | POA: Insufficient documentation

## 2017-09-03 DIAGNOSIS — F4310 Post-traumatic stress disorder, unspecified: Secondary | ICD-10-CM | POA: Insufficient documentation

## 2017-09-03 DIAGNOSIS — S43431A Superior glenoid labrum lesion of right shoulder, initial encounter: Secondary | ICD-10-CM | POA: Diagnosis not present

## 2017-09-03 DIAGNOSIS — I10 Essential (primary) hypertension: Secondary | ICD-10-CM | POA: Insufficient documentation

## 2017-09-03 DIAGNOSIS — M75101 Unspecified rotator cuff tear or rupture of right shoulder, not specified as traumatic: Secondary | ICD-10-CM | POA: Insufficient documentation

## 2017-09-03 DIAGNOSIS — E119 Type 2 diabetes mellitus without complications: Secondary | ICD-10-CM | POA: Insufficient documentation

## 2017-09-03 DIAGNOSIS — G709 Myoneural disorder, unspecified: Secondary | ICD-10-CM | POA: Insufficient documentation

## 2017-09-03 DIAGNOSIS — Z794 Long term (current) use of insulin: Secondary | ICD-10-CM | POA: Insufficient documentation

## 2017-09-03 DIAGNOSIS — F0390 Unspecified dementia without behavioral disturbance: Secondary | ICD-10-CM | POA: Insufficient documentation

## 2017-09-03 HISTORY — PX: SHOULDER OPEN ROTATOR CUFF REPAIR: SHX2407

## 2017-09-03 HISTORY — PX: SUBACROMIAL DECOMPRESSION: SHX5174

## 2017-09-03 LAB — GLUCOSE, CAPILLARY
Glucose-Capillary: 192 mg/dL — ABNORMAL HIGH (ref 65–99)
Glucose-Capillary: 191 mg/dL — ABNORMAL HIGH (ref 65–99)

## 2017-09-03 SURGERY — DECOMPRESSION, SUBACROMIAL SPACE
Anesthesia: General | Site: Shoulder | Laterality: Right

## 2017-09-03 MED ORDER — MIDAZOLAM HCL 2 MG/2ML IJ SOLN
INTRAMUSCULAR | Status: AC
  Administered 2017-09-03: 2 mg
  Filled 2017-09-03: qty 2

## 2017-09-03 MED ORDER — ONDANSETRON HCL 4 MG/2ML IJ SOLN
INTRAMUSCULAR | Status: DC | PRN
  Administered 2017-09-03: 4 mg via INTRAVENOUS

## 2017-09-03 MED ORDER — MEPERIDINE HCL 50 MG/ML IJ SOLN: 6.2500 mg | INTRAMUSCULAR | Status: DC | PRN

## 2017-09-03 MED ORDER — ROPIVACAINE HCL 5 MG/ML IJ SOLN
INTRAMUSCULAR | Status: DC | PRN
  Administered 2017-09-03: 30 mL via EPIDURAL

## 2017-09-03 MED ORDER — ALBUTEROL SULFATE (2.5 MG/3ML) 0.083% IN NEBU
INHALATION_SOLUTION | RESPIRATORY_TRACT | Status: AC
  Filled 2017-09-03: qty 3

## 2017-09-03 MED ORDER — LIDOCAINE 2% (20 MG/ML) 5 ML SYRINGE
INTRAMUSCULAR | Status: AC
  Filled 2017-09-03: qty 5

## 2017-09-03 MED ORDER — SODIUM CHLORIDE 0.9 % IR SOLN
Status: DC | PRN
  Administered 2017-09-03: 6000 mL

## 2017-09-03 MED ORDER — EPHEDRINE SULFATE 50 MG/ML IJ SOLN
INTRAMUSCULAR | Status: DC | PRN
  Administered 2017-09-03: 10 mg via INTRAVENOUS

## 2017-09-03 MED ORDER — ACETAMINOPHEN 10 MG/ML IV SOLN
INTRAVENOUS | Status: AC
  Filled 2017-09-03: qty 100

## 2017-09-03 MED ORDER — CEFAZOLIN SODIUM-DEXTROSE 2-4 GM/100ML-% IV SOLN
2.0000 g | INTRAVENOUS | Status: AC
  Administered 2017-09-03: 2 g via INTRAVENOUS
  Filled 2017-09-03: qty 100

## 2017-09-03 MED ORDER — METHOCARBAMOL 500 MG PO TABS: 500.0000 mg | ORAL_TABLET | Freq: Three times a day (TID) | ORAL | 1 refills | Status: DC | PRN

## 2017-09-03 MED ORDER — GLYCOPYRROLATE PF 0.2 MG/ML IJ SOSY
PREFILLED_SYRINGE | INTRAMUSCULAR | Status: DC | PRN
  Administered 2017-09-03 (×2): .2 mg via INTRAVENOUS

## 2017-09-03 MED ORDER — SUGAMMADEX SODIUM 200 MG/2ML IV SOLN
INTRAVENOUS | Status: AC
  Filled 2017-09-03: qty 2

## 2017-09-03 MED ORDER — FENTANYL CITRATE (PF) 100 MCG/2ML IJ SOLN: 25.0000 ug | INTRAMUSCULAR | Status: DC | PRN

## 2017-09-03 MED ORDER — OXYCODONE-ACETAMINOPHEN 5-325 MG PO TABS
1.0000 | ORAL_TABLET | Freq: Once | ORAL | Status: AC
  Administered 2017-09-03: 1 via ORAL

## 2017-09-03 MED ORDER — ONDANSETRON HCL 4 MG/2ML IJ SOLN
INTRAMUSCULAR | Status: AC
  Filled 2017-09-03: qty 2

## 2017-09-03 MED ORDER — FENTANYL CITRATE (PF) 250 MCG/5ML IJ SOLN
INTRAMUSCULAR | Status: AC
  Filled 2017-09-03: qty 5

## 2017-09-03 MED ORDER — PROPOFOL 10 MG/ML IV BOLUS
INTRAVENOUS | Status: DC | PRN
  Administered 2017-09-03: 200 mg via INTRAVENOUS

## 2017-09-03 MED ORDER — OXYCODONE-ACETAMINOPHEN 5-325 MG PO TABS: 1.0000 | ORAL_TABLET | ORAL | 0 refills | Status: AC | PRN

## 2017-09-03 MED ORDER — DEXAMETHASONE SODIUM PHOSPHATE 10 MG/ML IJ SOLN
INTRAMUSCULAR | Status: AC
  Filled 2017-09-03: qty 1

## 2017-09-03 MED ORDER — PROPOFOL 10 MG/ML IV BOLUS
INTRAVENOUS | Status: AC
Start: 2017-09-03 — End: ?
  Filled 2017-09-03: qty 20

## 2017-09-03 MED ORDER — 0.9 % SODIUM CHLORIDE (POUR BTL) OPTIME
TOPICAL | Status: DC | PRN
  Administered 2017-09-03: 1000 mL

## 2017-09-03 MED ORDER — BUPIVACAINE-EPINEPHRINE (PF) 0.5% -1:200000 IJ SOLN
INTRAMUSCULAR | Status: AC
  Filled 2017-09-03: qty 30

## 2017-09-03 MED ORDER — METOCLOPRAMIDE HCL 5 MG/ML IJ SOLN: 10.0000 mg | Freq: Once | INTRAMUSCULAR | Status: DC | PRN

## 2017-09-03 MED ORDER — PHENYLEPHRINE 40 MCG/ML (10ML) SYRINGE FOR IV PUSH (FOR BLOOD PRESSURE SUPPORT)
PREFILLED_SYRINGE | INTRAVENOUS | Status: DC | PRN
  Administered 2017-09-03 (×6): 80 ug via INTRAVENOUS

## 2017-09-03 MED ORDER — OXYCODONE-ACETAMINOPHEN 5-325 MG PO TABS
ORAL_TABLET | ORAL | Status: AC
  Filled 2017-09-03: qty 1

## 2017-09-03 MED ORDER — SUGAMMADEX SODIUM 500 MG/5ML IV SOLN
INTRAVENOUS | Status: AC
  Filled 2017-09-03: qty 5

## 2017-09-03 MED ORDER — BUPIVACAINE-EPINEPHRINE (PF) 0.25% -1:200000 IJ SOLN
INTRAMUSCULAR | Status: AC
  Filled 2017-09-03: qty 30

## 2017-09-03 MED ORDER — BUPIVACAINE-EPINEPHRINE 0.25% -1:200000 IJ SOLN
INTRAMUSCULAR | Status: DC | PRN
  Administered 2017-09-03: 6.5 mL

## 2017-09-03 MED ORDER — LACTATED RINGERS IV SOLN: INTRAVENOUS | Status: DC

## 2017-09-03 MED ORDER — EPHEDRINE SULFATE 50 MG/ML IJ SOLN
INTRAMUSCULAR | Status: AC
  Filled 2017-09-03: qty 1

## 2017-09-03 MED ORDER — FENTANYL CITRATE (PF) 250 MCG/5ML IJ SOLN
INTRAMUSCULAR | Status: DC | PRN
  Administered 2017-09-03: 50 ug via INTRAVENOUS

## 2017-09-03 MED ORDER — ROCURONIUM BROMIDE 10 MG/ML (PF) SYRINGE
PREFILLED_SYRINGE | INTRAVENOUS | Status: DC | PRN
  Administered 2017-09-03: 70 mg via INTRAVENOUS

## 2017-09-03 MED ORDER — PHENYLEPHRINE 40 MCG/ML (10ML) SYRINGE FOR IV PUSH (FOR BLOOD PRESSURE SUPPORT)
PREFILLED_SYRINGE | INTRAVENOUS | Status: AC
  Filled 2017-09-03: qty 10

## 2017-09-03 MED ORDER — LIDOCAINE 2% (20 MG/ML) 5 ML SYRINGE
INTRAMUSCULAR | Status: DC | PRN
  Administered 2017-09-03: 100 mg via INTRAVENOUS

## 2017-09-03 MED ORDER — CLONIDINE HCL (ANALGESIA) 100 MCG/ML EP SOLN
EPIDURAL | Status: DC | PRN
  Administered 2017-09-03: 100 ug

## 2017-09-03 MED ORDER — DEXAMETHASONE SODIUM PHOSPHATE 10 MG/ML IJ SOLN
INTRAMUSCULAR | Status: DC | PRN
  Administered 2017-09-03: 4 mg via INTRAVENOUS

## 2017-09-03 MED ORDER — FENTANYL CITRATE (PF) 100 MCG/2ML IJ SOLN
INTRAMUSCULAR | Status: AC
  Administered 2017-09-03: 100 ug
  Filled 2017-09-03: qty 2

## 2017-09-03 MED ORDER — ROCURONIUM BROMIDE 10 MG/ML (PF) SYRINGE
PREFILLED_SYRINGE | INTRAVENOUS | Status: AC
  Filled 2017-09-03: qty 5

## 2017-09-03 MED ORDER — ACETAMINOPHEN 10 MG/ML IV SOLN
INTRAVENOUS | Status: DC | PRN
  Administered 2017-09-03: 1000 mg via INTRAVENOUS

## 2017-09-03 MED ORDER — STERILE WATER FOR IRRIGATION IR SOLN
Status: DC | PRN
  Administered 2017-09-03: 100 mL

## 2017-09-03 MED ORDER — LACTATED RINGERS IV SOLN
INTRAVENOUS | Status: DC
  Administered 2017-09-03: 07:00:00 via INTRAVENOUS

## 2017-09-03 MED ORDER — ALBUTEROL SULFATE (2.5 MG/3ML) 0.083% IN NEBU
2.5000 mg | INHALATION_SOLUTION | Freq: Once | RESPIRATORY_TRACT | Status: AC
Start: 2017-09-03 — End: 2017-09-03
  Administered 2017-09-03: 2.5 mg via RESPIRATORY_TRACT

## 2017-09-03 MED ORDER — CHLORHEXIDINE GLUCONATE 4 % EX LIQD: 60.0000 mL | Freq: Once | CUTANEOUS | Status: DC

## 2017-09-03 MED ORDER — SUGAMMADEX SODIUM 200 MG/2ML IV SOLN
INTRAVENOUS | Status: DC | PRN
  Administered 2017-09-03: 216 mg via INTRAVENOUS

## 2017-09-03 MED ORDER — PHENYLEPHRINE HCL 10 MG/ML IJ SOLN
INTRAVENOUS | Status: DC | PRN
  Administered 2017-09-03: 10:00:00 via INTRAVENOUS
  Administered 2017-09-03: 50 ug/min via INTRAVENOUS

## 2017-09-03 SURGICAL SUPPLY — 82 items
ANCH SUT 360D 2 2 LD 2 STRN (Anchor) ×1 IMPLANT
ANCHOR ALL- SUT RC 2 SUT Y-K (Anchor) ×1 IMPLANT
ANCHOR ALL-SUT RC 2 SUT Y-K (Anchor) IMPLANT
BIT DRILL 7/64X5 DISP (BIT) IMPLANT
BLADE AVERAGE 25X9 (BLADE) ×1 IMPLANT
BLADE LONG MED 31X9 (MISCELLANEOUS) ×1 IMPLANT
BLADE SURG 11 STRL SS (BLADE) ×2 IMPLANT
BUR OVAL 4.0 (BURR) ×1 IMPLANT
CLEANER TIP ELECTROSURG 2X2 (MISCELLANEOUS) ×2 IMPLANT
CLSR STERI-STRIP ANTIMIC 1/2X4 (GAUZE/BANDAGES/DRESSINGS) ×1 IMPLANT
COVER SURGICAL LIGHT HANDLE (MISCELLANEOUS) ×2 IMPLANT
DECANTER SPIKE VIAL GLASS SM (MISCELLANEOUS) ×1 IMPLANT
DRAPE IMP U-DRAPE 54X76 (DRAPES) ×2 IMPLANT
DRAPE INCISE IOBAN 66X45 STRL (DRAPES) ×2 IMPLANT
DRAPE ORTHO SPLIT 77X108 STRL (DRAPES) ×4
DRAPE STERI 35X30 U-POUCH (DRAPES) ×2 IMPLANT
DRAPE SURG ORHT 6 SPLT 77X108 (DRAPES) ×2 IMPLANT
DRAPE U-SHAPE 47X51 STRL (DRAPES) ×2 IMPLANT
DRILL BIT 5/64 (BIT) ×2 IMPLANT
DRSG EMULSION OIL 3X3 NADH (GAUZE/BANDAGES/DRESSINGS) ×3 IMPLANT
DRSG PAD ABDOMINAL 8X10 ST (GAUZE/BANDAGES/DRESSINGS) ×3 IMPLANT
DURAPREP 26ML APPLICATOR (WOUND CARE) ×2 IMPLANT
ELECT NDL TIP 2.8 STRL (NEEDLE) ×1 IMPLANT
ELECT NEEDLE TIP 2.8 STRL (NEEDLE) ×2 IMPLANT
ELECT REM PT RETURN 9FT ADLT (ELECTROSURGICAL) ×2
ELECTRODE REM PT RTRN 9FT ADLT (ELECTROSURGICAL) ×1 IMPLANT
FACESHIELD WRAPAROUND (MASK) IMPLANT
FACESHIELD WRAPAROUND OR TEAM (MASK) ×2 IMPLANT
GAUZE SPONGE 4X4 12PLY STRL (GAUZE/BANDAGES/DRESSINGS) ×2 IMPLANT
GLOVE BIOGEL PI ORTHO PRO 7.5 (GLOVE) ×1
GLOVE BIOGEL PI ORTHO PRO SZ8 (GLOVE) ×2
GLOVE ORTHO TXT STRL SZ7.5 (GLOVE) ×3 IMPLANT
GLOVE PI ORTHO PRO STRL 7.5 (GLOVE) ×1 IMPLANT
GLOVE PI ORTHO PRO STRL SZ8 (GLOVE) ×1 IMPLANT
GLOVE SURG ORTHO 8.5 STRL (GLOVE) ×3 IMPLANT
GOWN STRL REUS W/ TWL LRG LVL3 (GOWN DISPOSABLE) ×2 IMPLANT
GOWN STRL REUS W/ TWL XL LVL3 (GOWN DISPOSABLE) ×2 IMPLANT
GOWN STRL REUS W/TWL LRG LVL3 (GOWN DISPOSABLE) ×2
GOWN STRL REUS W/TWL XL LVL3 (GOWN DISPOSABLE) ×4
KIT BASIN OR (CUSTOM PROCEDURE TRAY) ×2 IMPLANT
KIT JUGGERKNOT DISP 2.9MM (KITS) IMPLANT
KIT TURNOVER KIT B (KITS) ×2 IMPLANT
MANIFOLD NEPTUNE II (INSTRUMENTS) ×2 IMPLANT
NDL HYPO 25GX1X1/2 BEV (NEEDLE) ×1 IMPLANT
NDL SPNL 18GX3.5 QUINCKE PK (NEEDLE) ×1 IMPLANT
NDL SUT .5 MAYO 1.404X.05X (NEEDLE) ×1 IMPLANT
NDL SUT 6 .5 CRC .975X.05 MAYO (NEEDLE) ×1 IMPLANT
NEEDLE HYPO 25GX1X1/2 BEV (NEEDLE) ×2 IMPLANT
NEEDLE MAYO TAPER (NEEDLE) ×4
NEEDLE SPNL 18GX3.5 QUINCKE PK (NEEDLE) ×2 IMPLANT
NS IRRIG 1000ML POUR BTL (IV SOLUTION) ×2 IMPLANT
PACK SHOULDER (CUSTOM PROCEDURE TRAY) ×2 IMPLANT
PACK UNIVERSAL I (CUSTOM PROCEDURE TRAY) ×2 IMPLANT
PAD ARMBOARD 7.5X6 YLW CONV (MISCELLANEOUS) ×4 IMPLANT
RESECTOR FULL RADIUS 4.2MM (BLADE) ×2 IMPLANT
SET ARTHROSCOPY TUBING (MISCELLANEOUS) ×2
SET ARTHROSCOPY TUBING LN (MISCELLANEOUS) ×1 IMPLANT
SET JUGGERKNOT DISP 1.4MM (SET/KITS/TRAYS/PACK) IMPLANT
SLING ARM FOAM STRAP LRG (SOFTGOODS) ×2 IMPLANT
SLING ARM FOAM STRAP MED (SOFTGOODS) IMPLANT
SPONGE LAP 4X18 RFD (DISPOSABLE) ×4 IMPLANT
STRIP CLOSURE SKIN 1/2X4 (GAUZE/BANDAGES/DRESSINGS) ×2 IMPLANT
SUCTION FRAZIER HANDLE 10FR (MISCELLANEOUS) ×1
SUCTION TUBE FRAZIER 10FR DISP (MISCELLANEOUS) ×1 IMPLANT
SUT BONE WAX W31G (SUTURE) IMPLANT
SUT FIBERWIRE #2 38 T-5 BLUE (SUTURE)
SUT HI-FI 2 STRAND C-2 40 (SUTURE) ×1 IMPLANT
SUT MNCRL AB 3-0 PS2 18 (SUTURE) ×2 IMPLANT
SUT MNCRL AB 4-0 PS2 18 (SUTURE) ×2 IMPLANT
SUT VIC AB 0 CT1 27 (SUTURE)
SUT VIC AB 0 CT1 27XBRD ANBCTR (SUTURE) IMPLANT
SUT VIC AB 0 CT2 27 (SUTURE) IMPLANT
SUT VIC AB 2-0 CT1 27 (SUTURE) ×2
SUT VIC AB 2-0 CT1 TAPERPNT 27 (SUTURE) ×1 IMPLANT
SUTURE FIBERWR #2 38 T-5 BLUE (SUTURE) IMPLANT
SYR CONTROL 10ML LL (SYRINGE) ×2 IMPLANT
TOWEL OR 17X24 6PK STRL BLUE (TOWEL DISPOSABLE) ×2 IMPLANT
TOWEL OR 17X26 10 PK STRL BLUE (TOWEL DISPOSABLE) ×2 IMPLANT
TUBE CONNECTING 12X1/4 (SUCTIONS) ×2 IMPLANT
WAND HAND CNTRL MULTIVAC 90 (MISCELLANEOUS) ×2 IMPLANT
WATER STERILE IRR 1000ML POUR (IV SOLUTION) ×2 IMPLANT
YANKAUER SUCT BULB TIP NO VENT (SUCTIONS) IMPLANT

## 2017-09-03 NOTE — Brief Op Note (Signed)
09/03/2017  10:20 AM  PATIENT:  Jesus [name]  62 y.o. male  PRE-OPERATIVE DIAGNOSIS:  Right shoulder labral tear, rotator cuff tear, degenerative joint disease acromioclavicular joint  POST-OPERATIVE DIAGNOSIS:  Right shoulder labral tear, rotator cuff tear, degenerative joint disease acromioclavicular joint  PROCEDURE:  Procedure(s): RIGHT SHOULDER ARTHROSCOPY, SUB-ACROMIAL DECOMPRESSION, BICEPS TENODESIS,  MINI-OPEN ROTATOR CUFF REPAIR, OPEN DISTAL CLAVICLE RESECTION, labral debridement (Right) ROTATOR CUFF REPAIR SHOULDER OPEN (Right)  SURGEON:  Surgeon(s) and Role:    Beverely Low, MD - Primary  PHYSICIAN ASSISTANT:   ASSISTANTS: Thea Gist, PA-C   ANESTHESIA:   regional and general  EBL:  100 mL   BLOOD ADMINISTERED:none  DRAINS: none   LOCAL MEDICATIONS USED:  MARCAINE     SPECIMEN:  No Specimen  DISPOSITION OF SPECIMEN:  N/A  COUNTS:  YES  TOURNIQUET:  * No tourniquets in log *  DICTATION: .Other Dictation: Dictation Number [id number]  PLAN OF CARE: Discharge to home after PACU  PATIENT DISPOSITION:  PACU - hemodynamically stable.   Delay start of Pharmacological VTE agent (>24hrs) due to surgical blood loss or risk of bleeding: not applicable

## 2017-09-03 NOTE — Progress Notes (Signed)
Spoke with Dr. Acey Lav regarding patient's O2 sat dropping below 94%. MD ok with patient going home as long as O2 sat above 90%.

## 2017-09-03 NOTE — Anesthesia Procedure Notes (Signed)
Procedure Name: Intubation Date/Time: 09/03/2017 8:35 AM Performed by: Adria Dillonkin, Lovie Agresta A, CRNA Pre-anesthesia Checklist: Patient identified, Emergency Drugs available, Suction available and Patient being monitored Patient Re-evaluated:Patient Re-evaluated prior to induction Oxygen Delivery Method: Circle system utilized Preoxygenation: Pre-oxygenation with 100% oxygen Induction Type: IV induction Ventilation: Mask ventilation without difficulty and Oral airway inserted - appropriate to patient size Laryngoscope Size: Glidescope and 4 Grade View: Grade I Tube type: Oral Tube size: 7.5 mm Number of attempts: 1 Airway Equipment and Method: Rigid stylet Placement Confirmation: ETT inserted through vocal cords under direct vision,  positive ETCO2,  CO2 detector and breath sounds checked- equal and bilateral Secured at: 21 cm Tube secured with: Tape Dental Injury: Teeth and Oropharynx as per pre-operative assessment

## 2017-09-03 NOTE — Discharge Instructions (Signed)
Ice to the shoulder as much as you can.  Wear sling while up and walking around, may remove the sling and "hug a pillow" to relax the shoulder muscles while seated in the home.  Do NOT push pull or lift with the right arm.  Do the shoulder exercises every hour.  Do pendulums, lap slides, gentle hug and hitchhike rotation exercises.  Ok to move your wrist and elbow.  Follow up in the office with Dr Ranell PatrickNorris in two weeks, call [phone] for appt

## 2017-09-03 NOTE — Op Note (Signed)
NAME: Jesus [name], Jesus [name] MEDICAL RECORD [id number] ACCOUNT [id number] DATE OF BIRTH:[birth year] FACILITY: [facility] LOCATION: [facility] PHYSICIAN:STEVEN R. Brailyn Killion, MD  OPERATIVE REPORT  DATE OF PROCEDURE:  09/03/2017  PREOPERATIVE DIAGNOSIS:  Right shoulder rotator cuff tear and SLAP tear as well as acromioclavicular joint arthritis.  POSTOPERATIVE DIAGNOSIS:  Right shoulder rotator cuff tear and SLAP tear as well as acromioclavicular joint arthritis.  PROCEDURE PERFORMED:  Right shoulder arthroscopy with extensive intra-articular debridement of torn superior labrum, anterior to posterior arthroscopic biceps tenotomy followed by arthroscopic subacromial decompression with mini open rotator cuff repair  and biceps tenodesis in the groove as well as open distal clavicle resection.  ATTENDING SURGEON:  Malon Kindle, MD.  ASSISTANT:  Konrad Felix [place], [place], who was scrubbed during the entire procedure and necessary for satisfactory completion of the surgery.  ANESTHESIA:  General anesthesia  plus interscalene block.  ESTIMATED BLOOD LOSS:  Minimal.  FLUID REPLACEMENT:  1500 mL crystalloid.    INSTRUMENT COUNT:  Was correct.    COMPLICATIONS:  There were no complications.    Perioperative antibiotics were given.  INDICATIONS:  The patient is a 62 year old male with worsening right shoulder pain secondary to a documented partial thickness rotator cuff tear as well as possible SLAP tear and advanced AC arthritis as well as impingement syndrome.  The patient has  failed an extended period of conservative management and desires operative treatment to restore function and eliminate pain and informed consent obtained.  DESCRIPTION OF PROCEDURE:  After an adequate level of anesthesia was achieved, the patient was positioned in the modified beach chair position.  Right shoulder correctly identified and examined under anesthesia.  The patient had full passive range of  motion with no undue  stiffness, no instability.  After sterile prep and drape, we went ahead, reverified a timeout, correct patient, correct site.  We then entered the shoulder and standard arthroscopic portals including anterior, posterior and lateral  portals.  We identified significant tearing of a diminutive biceps tendon and superior labrum.  There was significant instability at the biceps anchor.  We performed a biceps tenotomy and labral debridement back to a stable labral rim.  Anterior,  inferior, posterior inferior labrum intact.  Articular cartilage was in good shape with minimal chondromalacia.  Subscapularis appeared normal as it inserted into the lesser tuberosity.  Rotator cuff had no obvious tear from the undersurface including  supraspinatus, infraspinatus and teres minor as seen from the anterior portal.  The posterior inferior labrum looked great as well from that perspective.  We then placed the scope in the subacromial space.  A thorough bursectomy and acromioplasty was  performed, creating a type 1 acromial shaped with a butcher block technique.  Utilizing high speed bur, we did release the CA ligament and inferior AC capsule compromise with obvious arthritic end of the clavicle seen.  We concluded after we did a  thorough bursectomy and acromioplasty and could visualize the bursal surface, there was a soft area just posterior to the bicipital groove consistent with the interstitial tear that we saw on the scan.  We concluded the arthroscopic portion of the  procedure, made a small saber incision overlying the AC joint.  Dissection down through the subcutaneous tissues using needle tip Bovie.  I identified the deltotrapezial fascia incised in line with distal clavicle.  We did subperiosteal dissection distal  clavicle followed by excision of distal 2 mm of bone using an oscillating saw.  We irrigated thoroughly, applied bone wax to the cut  end of the clavicle.  We removed the hypertrophied capsule and  osteophyte off the dorsal acromion at the [facility] joint  margin.  We then repaired the deltotrapezial fascia anatomically after the application of bone wax with 0 Vicryl suture, figure of eight, followed by 2-0 Vicryl for subcutaneous closure and 4-0 Monocryl for the skin.  We then addressed the biceps and  rotator cuff through a mini open incision starting at the anterolateral border of the acromion, extending distally about 3-4 cm.  Dissection down through subcutaneous tissues.  We identified the deltoid raphae between the anterior and lateral heads of  the deltoid and divided that with a needle tip Bovie.  I placed Arthrex retractor, identified the biceps tendon.  We delivered the biceps tendon out of the bicipital sheath and prepared for the biceps sheath with a needle tip Bovie and a Freer elevator  to scrape the bone and get it prepared for a tendon to bone healing.  We then whipstitched the biceps tendon at the appropriate length with #2 Hi-Fi suture by [employer]med Linvatec.  Next, we went ahead and took the whipstitch suture up through the rotator  interval and then back down into the subscapularis, so we had a nice longitudinal pull within the bicipital sheath.  We then oversewed with 0 Vicryl suture 8 distal to that tenodesis area.  We had a nice low profile soft tissue tenodesis very secure and  that should scar nicely.  At this point, we addressed the rotator cuff tear was palpable just posterior to the bicipital groove.  We incised longitudinally in line with the fibers encountering tendinopathic tissue in an interstitial tear.  We cleaned it  out with a rongeur and curette and prepared the greater tuberosity with a rongeur and placed a #1 RC anchor at the articular margin of the greater tuberosity and brought the suture out in a mattress fashion through the anterior and posterior portion of  the interrupted cuff and brought that  tendon together.  We also had two additional Hi-Fi sutures lateral to that,  we had an anatomic watertight repair.  We definitely increased the blood flow to the area with that repair.  We irrigated thoroughly,  ranged the shoulder.  There was no relative motion and no impingement.  We then closed the deltoid anatomically with 0 Vicryl suture followed by 2-0 Vicryl for subcutaneous closure and 4-0 Monocryl for skin and portals.  Steri-Strips were applied followed by sterile dressing.    The patient tolerated the surgery well.  AN/NUANCE  D:09/03/2017 T:09/03/2017 JOB:[id number]

## 2017-09-03 NOTE — Transfer of Care (Signed)
Immediate Anesthesia Transfer of Care Note  Patient: Jesus [name]  Procedure(s) Performed: RIGHT SHOULDER ARTHROSCOPY, SUB-ACROMIAL DECOMPRESSION, BICEPS TENODESIS,  MINI-OPEN ROTATOR CUFF REPAIR, OPEN DISTAL CLAVICLE RESECTION, labral debridement (Right Shoulder) ROTATOR CUFF REPAIR SHOULDER OPEN (Right Shoulder)  Patient Location: PACU  Anesthesia Type:GA combined with regional for post-op pain  Level of Consciousness: awake, drowsy and patient cooperative  Airway & Oxygen Therapy: Patient Spontanous Breathing and Patient connected to nasal cannula oxygen  Post-op Assessment: Report given to RN and Post -op Vital signs reviewed and stable  Post vital signs: Reviewed and stable  Last Vitals:  Vitals Value Taken Time  BP 114/62 09/03/2017 10:28 AM  Temp    Pulse 97 09/03/2017 10:29 AM  Resp 13 09/03/2017 10:29 AM  SpO2 96 % 09/03/2017 10:29 AM  Vitals shown include unvalidated device data.  Last Pain:  Vitals:   09/03/17 0641  TempSrc:   PainSc: 5          Complications: No apparent anesthesia complications

## 2017-09-03 NOTE — Anesthesia Postprocedure Evaluation (Signed)
Anesthesia Post Note  Patient: Jesus [name]  Procedure(s) Performed: RIGHT SHOULDER ARTHROSCOPY, SUB-ACROMIAL DECOMPRESSION, BICEPS TENODESIS,  MINI-OPEN ROTATOR CUFF REPAIR, OPEN DISTAL CLAVICLE RESECTION, labral debridement (Right Shoulder) ROTATOR CUFF REPAIR SHOULDER OPEN (Right Shoulder)     Patient location during evaluation: PACU Anesthesia Type: General Level of consciousness: awake and alert Pain management: pain level controlled Vital Signs Assessment: post-procedure vital signs reviewed and stable Respiratory status: spontaneous breathing, nonlabored ventilation, respiratory function stable and patient connected to nasal cannula oxygen Cardiovascular status: blood pressure returned to baseline and stable Postop Assessment: no apparent nausea or vomiting Anesthetic complications: no    Last Vitals:  Vitals:   09/03/17 1143 09/03/17 1158  BP: 123/71 113/66  Pulse: 86 85  Resp: 15 14  Temp:    SpO2: 94% 93%    Last Pain:  Vitals:   09/03/17 1158  TempSrc:   PainSc: 0-No pain                 Phillips Grout

## 2017-09-03 NOTE — Interval H&P Note (Signed)
History and Physical Interval Note:  09/03/2017 8:31 AM  Jesus [name]  has presented today for surgery, with the diagnosis of Right shoulder labral tear, rotator cuff tear, degenerative joint disease acromioclavicular joint  The various methods of treatment have been discussed with the patient and family. After consideration of risks, benefits and other options for treatment, the patient has consented to  Procedure(s): RIGHT SHOULDER ARTHROSCOPY, SUB-ACROMIAL DECOMPRESSION, BICEPS TENODESIS, MINI-OPEN ROTATOR CUFF REPAIR, OPEN DISTAL CLAVICLE RESECTION (Right) ROTATOR CUFF REPAIR SHOULDER OPEN (Right) as a surgical intervention .  The patient's history has been reviewed, patient examined, no change in status, stable for surgery.  I have reviewed the patient's chart and labs.  Questions were answered to the patient's satisfaction.     Resa Rinks,STEVEN R

## 2017-09-03 NOTE — Anesthesia Preprocedure Evaluation (Addendum)
Anesthesia Evaluation  Patient identified by MRN, date of birth, ID band Patient awake    Reviewed: Allergy & Precautions, NPO status , Patient's Chart, lab work & pertinent test results  Airway Mallampati: II  TM Distance: >3 FB Neck ROM: Full    Dental no notable dental hx. (+) Missing   Pulmonary neg pulmonary ROS,    Pulmonary exam normal breath sounds clear to auscultation       Cardiovascular hypertension, Pt. on medications Normal cardiovascular exam Rhythm:Regular Rate:Normal     Neuro/Psych Dementia H/o TBI negative neurological ROS     GI/Hepatic negative GI ROS, Neg liver ROS,   Endo/Other  diabetes, Well Controlled, Oral Hypoglycemic Agents, Insulin Dependent  Renal/GU negative Renal ROS  negative genitourinary   Musculoskeletal negative musculoskeletal ROS (+)   Abdominal   Peds negative pediatric ROS (+)  Hematology negative hematology ROS (+)   Anesthesia Other Findings Spinal stenosis  Reproductive/Obstetrics negative OB ROS                            Anesthesia Physical Anesthesia Plan  ASA: II  Anesthesia Plan: General   Post-op Pain Management:  Regional for Post-op pain   Induction: Intravenous  PONV Risk Score and Plan: 2 and Ondansetron, Dexamethasone, Midazolam and Treatment may vary due to age or medical condition  Airway Management Planned: Oral ETT  Additional Equipment:   Intra-op Plan:   Post-operative Plan: Extubation in OR  Informed Consent: I have reviewed the patients History and Physical, chart, labs and discussed the procedure including the risks, benefits and alternatives for the proposed anesthesia with the patient or authorized representative who has indicated his/her understanding and acceptance.   Dental advisory given  Plan Discussed with: CRNA  Anesthesia Plan Comments:         Anesthesia Quick Evaluation

## 2017-09-03 NOTE — Anesthesia Procedure Notes (Signed)
Anesthesia Regional Block: Supraclavicular block   Pre-Anesthetic Checklist: ,, timeout performed, Correct Patient, Correct Site, Correct Laterality, Correct Procedure, Correct Position, site marked, Risks and benefits discussed,  Surgical consent,  Pre-op evaluation,  At surgeon's request and post-op pain management  Laterality: Right and Upper  Prep: Maximum Sterile Barrier Precautions used, chloraprep       Needles:  Injection technique: Single-shot  Needle Type: Echogenic Stimulator Needle     Needle Length: 10cm      Additional Needles:   Procedures:,,,, ultrasound used (permanent image in chart),,,,  Narrative:  Start time: 09/03/2017 8:08 AM End time: 09/03/2017 8:18 AM Injection made incrementally with aspirations every 5 mL.  Performed by: Personally  Anesthesiologist: Phillips Groutarignan, Annalysia Willenbring, MD  Additional Notes: Risks, benefits and alternative to block explained extensively.  Patient tolerated procedure well, without complications.

## 2017-09-06 ENCOUNTER — Encounter (HOSPITAL_COMMUNITY): Payer: Self-pay | Admitting: Orthopedic Surgery

## 2017-10-06 DIAGNOSIS — F0781 Postconcussional syndrome: Secondary | ICD-10-CM | POA: Insufficient documentation

## 2018-02-25 DIAGNOSIS — R3915 Urgency of urination: Secondary | ICD-10-CM | POA: Insufficient documentation

## 2018-06-10 DIAGNOSIS — D649 Anemia, unspecified: Secondary | ICD-10-CM | POA: Insufficient documentation

## 2020-03-06 DIAGNOSIS — I2510 Atherosclerotic heart disease of native coronary artery without angina pectoris: Secondary | ICD-10-CM | POA: Insufficient documentation

## 2020-04-19 DIAGNOSIS — I252 Old myocardial infarction: Secondary | ICD-10-CM | POA: Insufficient documentation

## 2020-08-01 ENCOUNTER — Ambulatory Visit: Payer: Medicare Other | Admitting: Podiatry

## 2020-08-01 ENCOUNTER — Other Ambulatory Visit: Payer: Self-pay

## 2020-08-01 ENCOUNTER — Encounter: Payer: Self-pay | Admitting: Podiatry

## 2020-08-01 DIAGNOSIS — M2042 Other hammer toe(s) (acquired), left foot: Secondary | ICD-10-CM

## 2020-08-01 DIAGNOSIS — M436 Torticollis: Secondary | ICD-10-CM | POA: Insufficient documentation

## 2020-08-01 DIAGNOSIS — M2011 Hallux valgus (acquired), right foot: Secondary | ICD-10-CM

## 2020-08-01 DIAGNOSIS — M2012 Hallux valgus (acquired), left foot: Secondary | ICD-10-CM

## 2020-08-01 DIAGNOSIS — E1142 Type 2 diabetes mellitus with diabetic polyneuropathy: Secondary | ICD-10-CM

## 2020-08-01 DIAGNOSIS — M2041 Other hammer toe(s) (acquired), right foot: Secondary | ICD-10-CM | POA: Diagnosis not present

## 2020-08-01 NOTE — Progress Notes (Signed)
  Subjective:  Patient ID: Jesus [name], male    DOB: 1956-01-24,  MRN: [id number]  Chief Complaint  Patient presents with  . Foot Problem    I have a little spot on the 2nd toe left foot and the big toe has a spot and was bigger    65 y.o. male presents for wound care. Hx confirmed with patient.  Objective:  Physical Exam: Wound Location: left 2nd toe Wound Base: pre-ulcerative  Both feet warm and well perfused. Palpable pulses. Gross sensation diminished. HAV deformity bilat. HTs noted bilaterally. Assessment:   1. DM type 2 with diabetic peripheral neuropathy (HCC)   2. Acquired hallux valgus of both feet   3. Hammer toes of both feet    Plan:  Patient was evaluated and treated and all questions answered.  Ulcer left 2nd toe -Appears mostly resolved. No SOI noted. -We did discuss the importance of good shoegear given his HT formation that is causing areas of high pressure. Dispensed silicone toe caps. -Would benefit from DM shoes given DPN, HTs, HAV  Return in about 6 weeks (around 09/12/2020) for Wound Care.

## 2020-08-06 DIAGNOSIS — R55 Syncope and collapse: Secondary | ICD-10-CM | POA: Insufficient documentation

## 2020-08-06 DIAGNOSIS — S0181XA Laceration without foreign body of other part of head, initial encounter: Secondary | ICD-10-CM | POA: Insufficient documentation

## 2020-08-23 ENCOUNTER — Telehealth: Payer: Self-pay

## 2020-08-23 NOTE — Telephone Encounter (Signed)
Unfortunately he will have to pay for them so it is not easily feasible for it to be mailed

## 2020-08-23 NOTE — Telephone Encounter (Signed)
Pt calling to request another "great toe stall" be sent to him in the mail due to losing the one he has currently.

## 2020-08-26 NOTE — Telephone Encounter (Signed)
Pt notified and voiced understanding 

## 2020-09-12 ENCOUNTER — Encounter: Payer: Self-pay | Admitting: Podiatry

## 2020-09-12 ENCOUNTER — Ambulatory Visit: Payer: Medicare Other | Admitting: Podiatry

## 2020-09-12 ENCOUNTER — Other Ambulatory Visit: Payer: Self-pay

## 2020-09-12 DIAGNOSIS — M2012 Hallux valgus (acquired), left foot: Secondary | ICD-10-CM

## 2020-09-12 DIAGNOSIS — E1142 Type 2 diabetes mellitus with diabetic polyneuropathy: Secondary | ICD-10-CM

## 2020-09-12 DIAGNOSIS — M2011 Hallux valgus (acquired), right foot: Secondary | ICD-10-CM | POA: Diagnosis not present

## 2020-10-04 NOTE — Progress Notes (Signed)
  Subjective:  Patient ID: Jesus [name], male    DOB: 1955/12/20,  MRN: [id number]  Chief Complaint  Patient presents with   Foot Ulcer    The left big toe is getting better and healing up nicely and has some red color to it    65 y.o. male presents for wound care. Hx confirmed with patient.  Objective:  Physical Exam: Wound Location: left 2nd toe Wound Base: pre-ulcerative  Both feet warm and well perfused. Palpable pulses. Gross sensation diminished. HAV deformity bilat. HTs noted bilaterally. Assessment:   1. DM type 2 with diabetic peripheral neuropathy (HCC)   2. Acquired hallux valgus of both feet     Plan:  Patient was evaluated and treated and all questions answered.  Ulcer left 2nd toe -Again appears to have resolved.  Mild hyperkeratosis was debrided without open ulceration.  Diabetes mellitus and diabetic shoes at this time  No follow-ups on file.
